# Patient Record
Sex: Male | Born: 1965 | Race: White | Hispanic: No | Marital: Married | Smoking: Former smoker
Health system: Southern US, Community
[De-identification: ages and names within clinical notes are randomized; demographics above are authoritative.]

## PROBLEM LIST (undated history)

## (undated) DIAGNOSIS — I10 Essential (primary) hypertension: Secondary | ICD-10-CM

## (undated) HISTORY — PX: APPENDECTOMY: SHX54

---

## 2007-01-09 ENCOUNTER — Emergency Department: Payer: Self-pay | Admitting: Emergency Medicine

## 2007-01-09 ENCOUNTER — Other Ambulatory Visit: Payer: Self-pay

## 2008-02-23 IMAGING — CR DG CHEST 2V
1 series · 2 of 2 positions shown · non-contrast
Comparison: none

REASON FOR EXAM: Shortness of breath
COMMENTS:

PROCEDURE:     DXR - DXR CHEST PA (OR AP) AND LATERAL  - January 09, 2007  [DATE]
RESULT:
HISTORY: Shortness of breath.

[Series 1: view not recorded · 0.17mm/px · 2 of 2 slices shown]
[im 1/2]
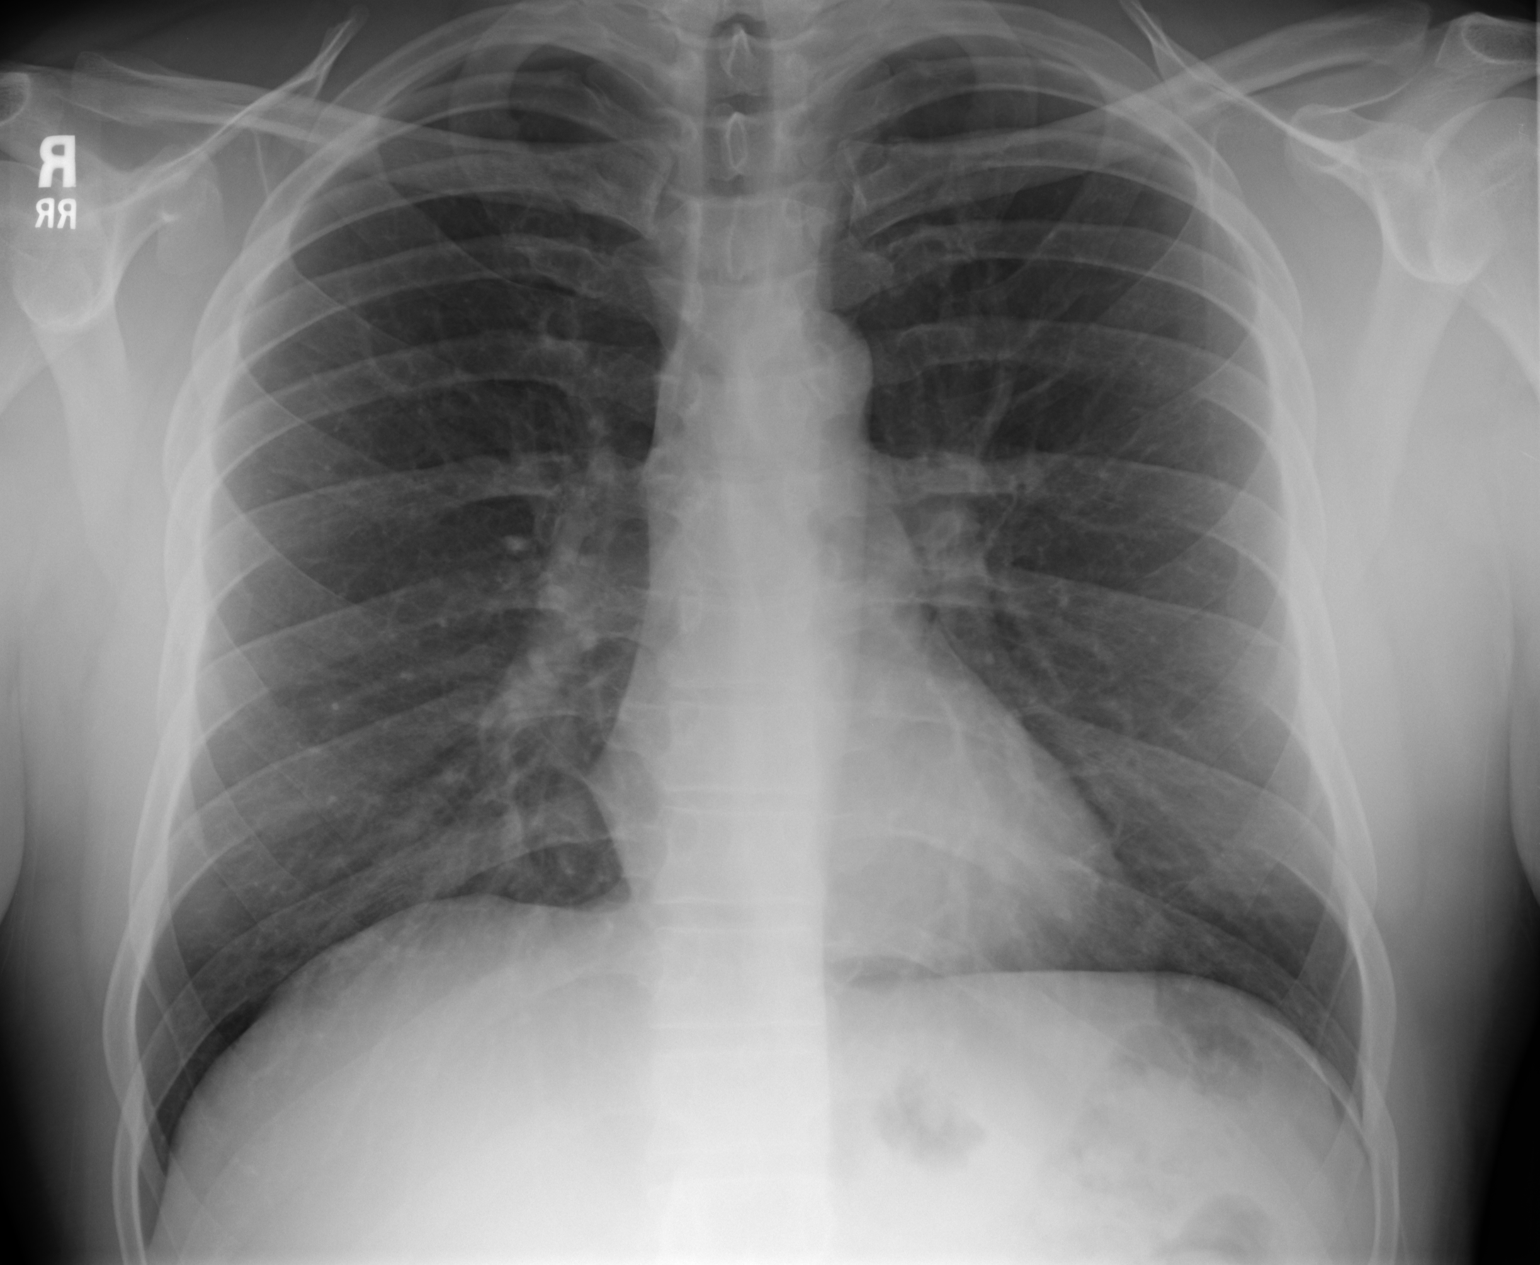
[im 2/2]
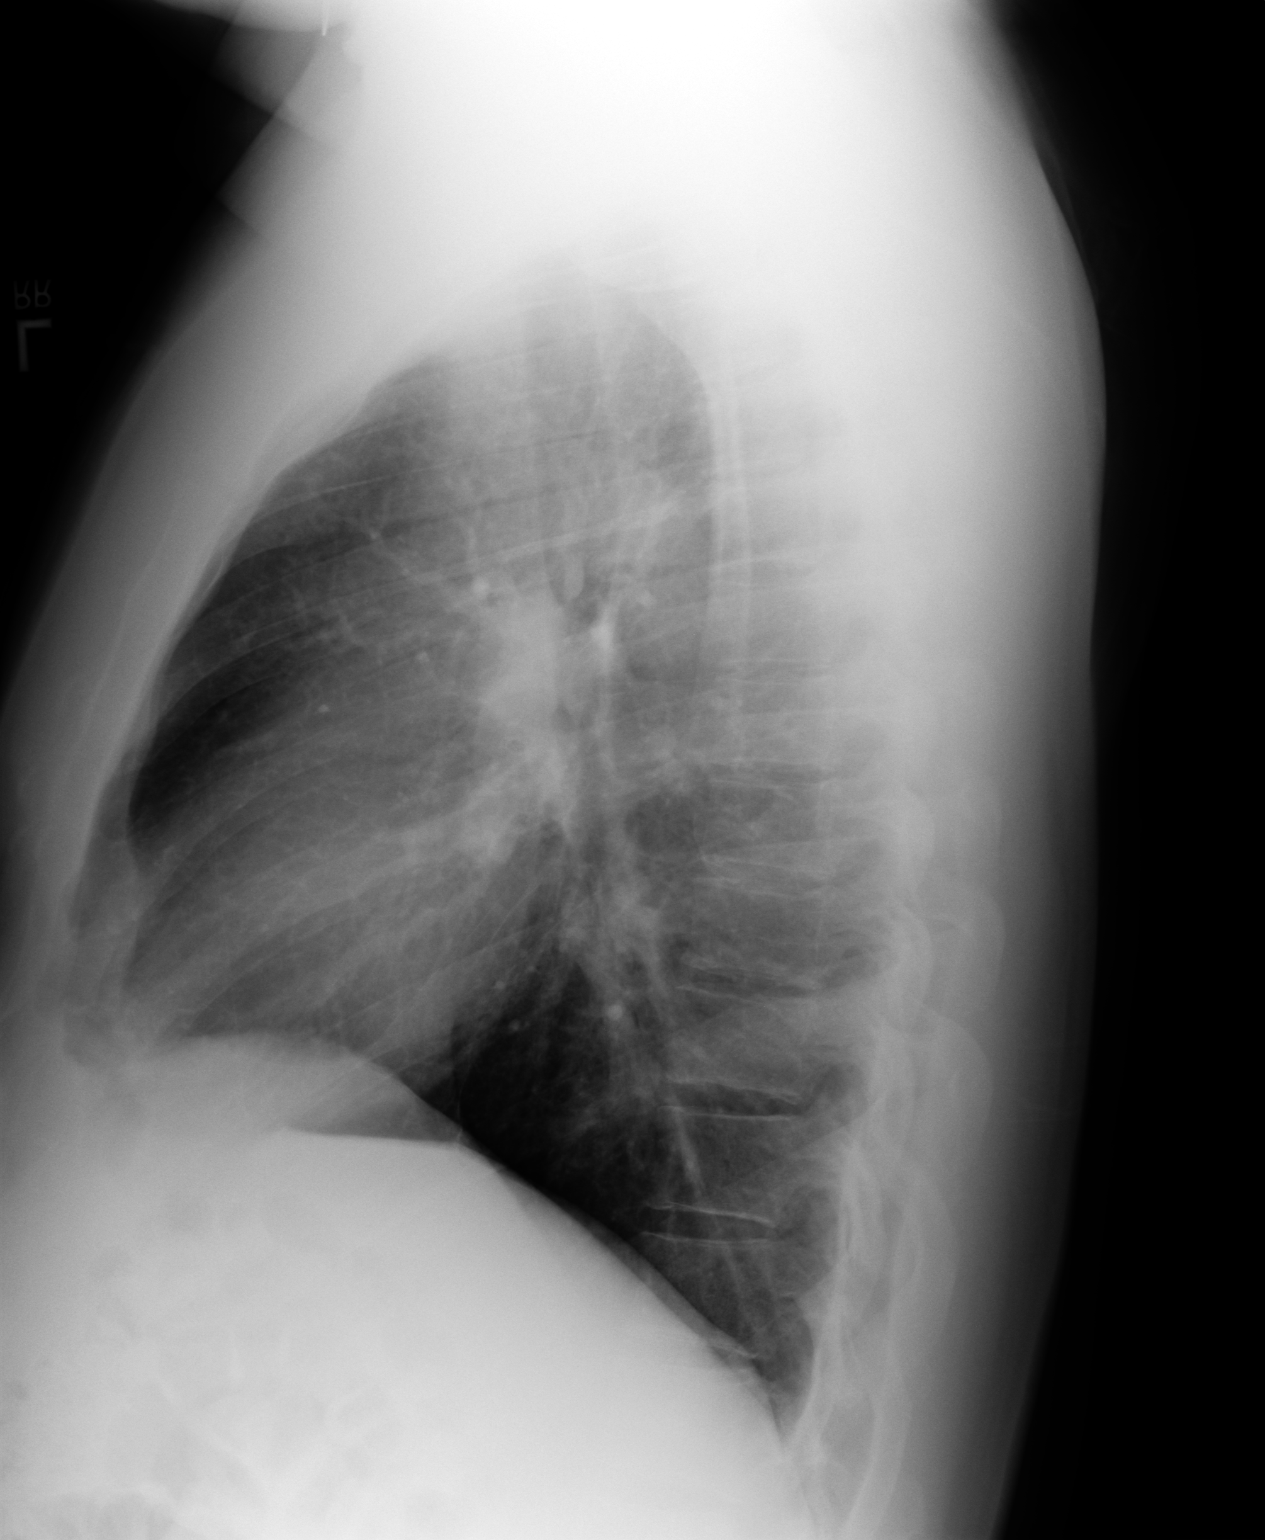

[2 of 2 positions shown; findings below may reference images not displayed]

FINDINGS: PA and lateral views of the chest are interpreted without
comparison and demonstrate a normal size cardiac silhouette.  The lungs
demonstrate no effusion, consolidation, or pneumothorax.  The bones and soft
tissue of the chest demonstrate no acute abnormality.
IMPRESSION: No acute cardiopulmonary abnormality is identified.

## 2020-11-29 ENCOUNTER — Observation Stay
Admission: EM | Admit: 2020-11-29 | Discharge: 2020-11-30 | Disposition: A | Discharge status: Home / Self Care | Payer: No Typology Code available for payment source | Attending: Internal Medicine | Admitting: Internal Medicine

## 2020-11-29 ENCOUNTER — Emergency Department: Payer: No Typology Code available for payment source

## 2020-11-29 ENCOUNTER — Other Ambulatory Visit: Payer: Self-pay

## 2020-11-29 DIAGNOSIS — I161 Hypertensive emergency: Secondary | ICD-10-CM | POA: Diagnosis present

## 2020-11-29 DIAGNOSIS — R079 Chest pain, unspecified: Secondary | ICD-10-CM

## 2020-11-29 DIAGNOSIS — R0789 Other chest pain: Principal | ICD-10-CM | POA: Insufficient documentation

## 2020-11-29 DIAGNOSIS — I10 Essential (primary) hypertension: Secondary | ICD-10-CM | POA: Insufficient documentation

## 2020-11-29 DIAGNOSIS — Z20822 Contact with and (suspected) exposure to covid-19: Secondary | ICD-10-CM | POA: Diagnosis not present

## 2020-11-29 DIAGNOSIS — R0602 Shortness of breath: Secondary | ICD-10-CM

## 2020-11-29 DIAGNOSIS — Z87891 Personal history of nicotine dependence: Secondary | ICD-10-CM | POA: Diagnosis not present

## 2020-11-29 DIAGNOSIS — E669 Obesity, unspecified: Secondary | ICD-10-CM | POA: Diagnosis present

## 2020-11-29 DIAGNOSIS — I160 Hypertensive urgency: Secondary | ICD-10-CM | POA: Diagnosis present

## 2020-11-29 HISTORY — DX: Essential (primary) hypertension: I10

## 2020-11-29 LAB — LIPID PANEL
Cholesterol: 189 mg/dL (ref 0–200)
HDL: 32 mg/dL — ABNORMAL LOW (ref >40)
LDL Cholesterol: 97 mg/dL (ref 0–99)
Total CHOL/HDL Ratio: 5.9 RATIO
Triglycerides: 299 mg/dL — ABNORMAL HIGH (ref <150)
VLDL: 60 mg/dL — ABNORMAL HIGH (ref 0–40)

## 2020-11-29 LAB — BASIC METABOLIC PANEL
Anion gap: 12 (ref 5–15)
BUN: 11 mg/dL (ref 6–20)
CO2: 26 mmol/L (ref 22–32)
Calcium: 9.3 mg/dL (ref 8.9–10.3)
Chloride: 100 mmol/L (ref 98–111)
Creatinine, Ser: 1.04 mg/dL (ref 0.61–1.24)
GFR, Estimated: >60 mL/min (ref >60)
Glucose, Bld: 141 mg/dL — ABNORMAL HIGH (ref 70–99)
Potassium: 3.9 mmol/L (ref 3.5–5.1)
Sodium: 138 mmol/L (ref 135–145)

## 2020-11-29 LAB — TROPONIN I (HIGH SENSITIVITY)
Troponin I (High Sensitivity): 125 ng/L (ref <18)
Troponin I (High Sensitivity): 143 ng/L (ref <18)

## 2020-11-29 LAB — CBC
HCT: 46 % (ref 39.0–52.0)
Hemoglobin: 16.5 g/dL (ref 13.0–17.0)
MCH: 31.5 pg (ref 26.0–34.0)
MCHC: 35.9 g/dL (ref 30.0–36.0)
MCV: 88 fL (ref 80.0–100.0)
Platelets: 193 10*3/uL (ref 150–400)
RBC: 5.23 MIL/uL (ref 4.22–5.81)
RDW: 12.5 % (ref 11.5–15.5)
WBC: 6.7 10*3/uL (ref 4.0–10.5)
nRBC: 0 % (ref 0.0–0.2)

## 2020-11-29 LAB — PROTIME-INR
INR: 0.9 (ref 0.8–1.2)
Prothrombin Time: 11.5 seconds (ref 11.4–15.2)

## 2020-11-29 LAB — HEPARIN LEVEL (UNFRACTIONATED): Heparin Unfractionated: 0.14 IU/mL — ABNORMAL LOW (ref 0.30–0.70)

## 2020-11-29 LAB — RESP PANEL BY RT-PCR (FLU A&B, COVID) ARPGX2
Influenza A by PCR: NEGATIVE
Influenza B by PCR: NEGATIVE
SARS Coronavirus 2 by RT PCR: NEGATIVE

## 2020-11-29 LAB — BRAIN NATRIURETIC PEPTIDE: B Natriuretic Peptide: 32 pg/mL (ref 0.0–100.0)

## 2020-11-29 LAB — APTT: aPTT: 31 seconds (ref 24–36)

## 2020-11-29 MED ORDER — ATORVASTATIN CALCIUM 20 MG PO TABS
40.0000 mg | ORAL_TABLET | Freq: Every day | ORAL | Status: DC
  Administered 2020-11-29: 40 mg via ORAL
  Filled 2020-11-29: qty 2

## 2020-11-29 MED ORDER — LABETALOL HCL 5 MG/ML IV SOLN
10.0000 mg | Freq: Once | INTRAVENOUS | Status: AC
  Administered 2020-11-29: 10 mg via INTRAVENOUS
  Filled 2020-11-29: qty 4

## 2020-11-29 MED ORDER — HEPARIN BOLUS VIA INFUSION
4000.0000 [IU] | Freq: Once | INTRAVENOUS | Status: AC
  Administered 2020-11-29: 4000 [IU] via INTRAVENOUS
  Filled 2020-11-29: qty 4000

## 2020-11-29 MED ORDER — LABETALOL HCL 5 MG/ML IV SOLN: 10.0000 mg | INTRAVENOUS | Status: DC | PRN

## 2020-11-29 MED ORDER — ACETAMINOPHEN 325 MG PO TABS: 325.0000 mg | ORAL_TABLET | ORAL | Status: DC | PRN

## 2020-11-29 MED ORDER — ONDANSETRON HCL 4 MG/2ML IJ SOLN
4.0000 mg | Freq: Four times a day (QID) | INTRAMUSCULAR | Status: DC | PRN
  Administered 2020-11-29: 4 mg via INTRAVENOUS
  Filled 2020-11-29: qty 2

## 2020-11-29 MED ORDER — ASPIRIN 81 MG PO CHEW
162.0000 mg | CHEWABLE_TABLET | Freq: Once | ORAL | Status: AC
  Administered 2020-11-29: 162 mg via ORAL
  Filled 2020-11-29: qty 2

## 2020-11-29 MED ORDER — NITROGLYCERIN 2 % TD OINT
1.0000 [in_us] | TOPICAL_OINTMENT | Freq: Once | TRANSDERMAL | Status: AC
  Administered 2020-11-29: 1 [in_us] via TOPICAL
  Filled 2020-11-29: qty 1

## 2020-11-29 MED ORDER — MORPHINE SULFATE (PF) 2 MG/ML IV SOLN
1.0000 mg | INTRAVENOUS | Status: AC | PRN
  Administered 2020-11-29 – 2020-11-30 (×2): 1 mg via INTRAVENOUS
  Filled 2020-11-29 (×3): qty 1

## 2020-11-29 MED ORDER — HEPARIN (PORCINE) 25000 UT/250ML-% IV SOLN
2100.0000 [IU]/h | INTRAVENOUS | Status: DC
  Administered 2020-11-29: 1300 [IU]/h via INTRAVENOUS
  Administered 2020-11-30: 1700 [IU]/h via INTRAVENOUS
  Filled 2020-11-29 (×2): qty 250

## 2020-11-29 NOTE — ED Notes (Signed)
MD in room

## 2020-11-29 NOTE — H&P (Signed)
History and Physical   Willie Rose DQQ:[id number] DOB: Jun 29, 1966 DOA: 11/29/2020  PCP: Patient, No Pcp Per  Outpatient Specialists: none  Patient coming from: home via private vehicle  I have personally briefly reviewed patient's old medical records in [facility] EMR.  Chief Concern: hypertensive emergency  HPI: Willie Rose is a 54 y.o. male with medical history significant for chronic L4-5 lumbar back pain on flexeril, presented to the ED from urgent care center via private vehicle for chief concern of SBP 240.  He reports he has not been to the primary provider for years and is currently not taking any medications for blood pressure.  He reports that the chest pressure and shortness of breath started on 11/28/2020.  He gets intermittent episodes while at rest and while doing something.  He reports that when he gets these episodes they last approximately 10 to 15 minutes.  The goal went away on their own.  He denies numbness and tingling to his extremities.  He denies jaw discomfort.  He denies abnormal taste in his mouth.  He states he drove to the ED last evening for these symptoms and parked in the parking lot of the emergency department, however he did not go inside because the symptoms went away.   He denies having feelings like this before.  Social history: lives spouse, he runs an Adult nurse. Infrequent tobacco use and as not smoked in 2.5 weeks. Endorses etoh use 3-4 x/week and each time is 2- beers at a time. Endorses marijuana smoking once per week in [place].   Allergies: none   ED Course: Discussed with ED provider, requesting admission for NSTEMI secondary to hypertensive emergency  Review of Systems: As per HPI otherwise 10 point review of systems negative.  Assessment/Plan  Principal Problem:   Chest pain at rest Active Problems:   Hypertensive emergency   Obesity with body mass index (BMI) of 30.0 to 39.9   Shortness of breath   Hypertensive  emergency- -Per ED provider, at urgent care center his SBP was 240 -In the ED, his blood pressure was initially 187-195 SBP status post labetalol 10 mg IV once per ED provider -Admit to progressive cardiac observation  Chest pain with elevated troponin-suspect multifactorial including NSTEMI versus hypertensive emergency/urgency -Heparin GTT -Status post aspirin 162 mg p.o. in the ED and nitroglycerin ointment per ED provider  -Cardiology consulted - checking lipid panel  - atorvastatin 40 mg qhs today  Tobacco use-counseling given to patient to quit and he endorses understanding and compliance  Prn medications: ondansetron, acetaminophen, and morphine  Chart reviewed.   DVT prophylaxis: Heparin GGT Code Status: full code  Diet: Heart healthy, n.p.o. at midnight Family Communication: discussed with spouse at beside Disposition Plan: pending clinical course Consults called: cardiology Admission status: observation with telemetery  Past Medical History:  Diagnosis Date  . Hypertension    Social History:  reports that he has quit smoking. He has quit using smokeless tobacco. He reports current alcohol use. He reports that he does not use drugs.  Family history: Family history reviewed and mother passed away from MI at age 64.   Prior to Admission medications   Not on File   Physical Exam: Vitals:   11/29/20 1424 11/29/20 1630 11/29/20 1640 11/29/20 1700  BP: (!) 187/110 (!) 192/133  (!) 195/135  Pulse: 88 75 83 74  Resp: 20 16 19 13   Temp: 98.3 F (36.8 C)     TempSrc: Oral  SpO2: 94% 96% 96% 95%  Weight: 117.9 kg     Height: 6' (1.829 m)      Constitutional: appears age appropriate , NAD, calm, comfortable Eyes: PERRL, lids and conjunctivae normal ENMT: Mucous membranes are moist. Posterior pharynx clear of any exudate or lesions. Age-appropriate dentition. Hearing appropriate Neck: normal, supple, no masses, no thyromegaly Respiratory: clear to auscultation  bilaterally, no wheezing, no crackles. Normal respiratory effort. No accessory muscle use.  Cardiovascular: Regular rate and rhythm, no murmurs / rubs / gallops. No extremity edema. 2+ pedal pulses. No carotid bruits.  Abdomen: no tenderness, no masses palpated, no hepatosplenomegaly. Bowel sounds positive.  Musculoskeletal: no clubbing / cyanosis. No joint deformity upper and lower extremities. Good ROM, no contractures, no atrophy. Normal muscle tone.  Skin: no rashes, lesions, ulcers. No induration Neurologic: Sensation intact. Strength 5/5 in all 4.  Psychiatric: Normal judgment and insight. Alert and oriented x 3. Normal mood.   EKG: Independently reviewed, showing NSR with rate of 88, Qtc 459  Chest x-ray on Admission: Personally reviewed and I agree with radiologist reading as below.  DG Chest Portable 1 View  Result Date: 11/29/2020 CLINICAL DATA:  Chest pressure on the left for 2 weeks. EXAM: PORTABLE CHEST 1 VIEW COMPARISON:  PA and lateral chest 01/09/2007. FINDINGS: Lungs clear. Heart size normal. Aortic atherosclerosis. No pneumothorax or pleural fluid. IMPRESSION: No acute disease. Aortic Atherosclerosis (ICD10-I70.0). Electronically Signed   By: Drusilla Kanner M.D.   On: 11/29/2020 16:21   Labs on Admission: I have personally reviewed following labs  CBC: Recent Labs  Lab 11/29/20 1426  WBC 6.7  HGB 16.5  HCT 46.0  MCV 88.0  PLT 193   Basic Metabolic Panel: Recent Labs  Lab 11/29/20 1426  NA 138  K 3.9  CL 100  CO2 26  GLUCOSE 141*  BUN 11  CREATININE 1.04  CALCIUM 9.3   Coagulation Profile: Recent Labs  Lab 11/29/20 1641  INR 0.9   Breeonna Mone N Adarryl Goldammer D.O. [facility]  If 12AM-7AM, please contact overnight-coverage provider If 7AM-7PM, please contact day coverage provider [email or web address]  11/29/2020, 5:49 PM

## 2020-11-29 NOTE — ED Notes (Signed)
Date and time results received: 11/29/20 15:09  Test: troponin Critical Value: 125  Name of Provider Notified: Cyril Loosen, MD  Orders Received? Or Actions Taken?: Protocols continued.

## 2020-11-29 NOTE — ED Provider Notes (Addendum)
[facility] Emergency Department Provider Note    First MD Initiated Contact with Patient 11/29/20 1556     (approximate)  I have reviewed the triage vital signs and the nursing notes.   HISTORY  Chief Complaint Chest Pain and Hypertension    HPI Willie Rose is a 54 y.o. male with a history of high blood pressure presents to the ER for several weeks of intermittent chest pain describes it as dull achy sitting on his chest lasting roughly 15 to 20 minutes.  Occasional palpitations.  Not currently on any antihypertensive medications.  Has been under some stress recently.  Just recently stopped smoking.  Denies any history of cardiac illness.  Had an episode last night where he actually drove to the ER parking lot but decided did not come in.  Was seen in [facility] this morning was sent to the ER due to significantly elevated blood pressure and description of chest pain.    Past Medical History:  Diagnosis Date  . Hypertension    No family history on file.  There are no problems to display for this patient.     Prior to Admission medications   Not on File    Allergies Patient has no allergy information on record.    Social History Social History   Tobacco Use  . Smoking status: Former Games developer  . Smokeless tobacco: Former Engineer, water Use Topics  . Alcohol use: Yes  . Drug use: Never    Review of Systems Patient denies headaches, rhinorrhea, blurry vision, numbness, shortness of breath, chest pain, edema, cough, abdominal pain, nausea, vomiting, diarrhea, dysuria, fevers, rashes or hallucinations unless otherwise stated above in HPI. ____________________________________________   PHYSICAL EXAM:  VITAL SIGNS: Vitals:   11/29/20 1640 11/29/20 1700  BP:  (!) 195/135  Pulse: 83 74  Resp: 19 13  Temp:    SpO2: 96% 95%    Constitutional: Alert and oriented.  Eyes: Conjunctivae are normal.  Head: Atraumatic. Nose: No  congestion/rhinnorhea. Mouth/Throat: Mucous membranes are moist.   Neck: No stridor. Painless ROM.  Cardiovascular: Normal rate, regular rhythm. Grossly normal heart sounds.  Good peripheral circulation. Respiratory: Normal respiratory effort.  No retractions. Lungs CTAB. Gastrointestinal: Soft and nontender. No distention. No abdominal bruits. No CVA tenderness. Genitourinary:  Musculoskeletal: No lower extremity tenderness nor edema.  No joint effusions. Neurologic:  Normal speech and language. No gross focal neurologic deficits are appreciated. No facial droop Skin:  Skin is warm, dry and intact. No rash noted. Psychiatric: Mood and affect are normal. Speech and behavior are normal.  ____________________________________________   LABS (all labs ordered are listed, but only abnormal results are displayed)  Results for orders placed or performed during the hospital encounter of 11/29/20 (from the past 24 hour(s))  Basic metabolic panel     Status: Abnormal   Collection Time: 11/29/20  2:26 PM  Result Value Ref Range   Sodium 138 135 - 145 mmol/L   Potassium 3.9 3.5 - 5.1 mmol/L   Chloride 100 98 - 111 mmol/L   CO2 26 22 - 32 mmol/L   Glucose, Bld 141 (H) 70 - 99 mg/dL   BUN 11 6 - 20 mg/dL   Creatinine, Ser 1.60 0.61 - 1.24 mg/dL   Calcium 9.3 8.9 - 73.7 mg/dL   GFR, Estimated >10 >62 mL/min   Anion gap 12 5 - 15  CBC     Status: None   Collection Time: 11/29/20  2:26  PM  Result Value Ref Range   WBC 6.7 4.0 - 10.5 K/uL   RBC 5.23 4.22 - 5.81 MIL/uL   Hemoglobin 16.5 13.0 - 17.0 g/dL   HCT 91.4 39 - 52 %   MCV 88.0 80.0 - 100.0 fL   MCH 31.5 26.0 - 34.0 pg   MCHC 35.9 30.0 - 36.0 g/dL   RDW 78.2 95.6 - 21.3 %   Platelets 193 150 - 400 K/uL   nRBC 0.0 0.0 - 0.2 %  Troponin I (High Sensitivity)     Status: Abnormal   Collection Time: 11/29/20  2:26 PM  Result Value Ref Range   Troponin I (High Sensitivity) 125 (HH) <18 ng/L  Brain natriuretic peptide     Status: None    Collection Time: 11/29/20  2:26 PM  Result Value Ref Range   B Natriuretic Peptide 32.0 0.0 - 100.0 pg/mL  Protime-INR     Status: None   Collection Time: 11/29/20  4:41 PM  Result Value Ref Range   Prothrombin Time 11.5 11.4 - 15.2 seconds   INR 0.9 0.8 - 1.2  APTT     Status: None   Collection Time: 11/29/20  4:41 PM  Result Value Ref Range   aPTT 31 24 - 36 seconds   ____________________________________________  EKG My review and personal interpretation at Time: 14:28   Indication: chest pain  Rate: 85  Rhythm: sinus Axis: normal Other: normal intervals, no stemi ____________________________________________  RADIOLOGY  I personally reviewed all radiographic images ordered to evaluate for the above acute complaints and reviewed radiology reports and findings.  These findings were personally discussed with the patient.  Please see medical record for radiology report.  ____________________________________________   PROCEDURES  Procedure(s) performed:  .Critical Care Performed by: Willy Eddy, MD Authorized by: Willy Eddy, MD   Critical care provider statement:    Critical care time (minutes):  35   Critical care time was exclusive of:  Separately billable procedures and treating other patients   Critical care was necessary to treat or prevent imminent or life-threatening deterioration of the following conditions:  Cardiac failure   Critical care was time spent personally by me on the following activities:  Development of treatment plan with patient or surrogate, discussions with consultants, evaluation of patient's response to treatment, examination of patient, obtaining history from patient or surrogate, ordering and performing treatments and interventions, ordering and review of laboratory studies, ordering and review of radiographic studies, pulse oximetry, re-evaluation of patient's condition and review of old charts      Critical Care performed:  yes ____________________________________________   INITIAL IMPRESSION / ASSESSMENT AND PLAN / ED COURSE  Pertinent labs & imaging results that were available during my care of the patient were reviewed by me and considered in my medical decision making (see chart for details).   DDX: ACS, pericarditis, esophagitis, pe, dissection, pna, bronchitis, costochondritis   EDREES VALENT is a 54 y.o. who presents to the ED with symptoms as described above.  Patient nontoxic-appearing but with significant elevated blood pressure.  EKG with some nonspecific changes.  No STEMI criteria.  Troponin is elevated.  IVC secondary to demand ischemia but given his description of chest pains also concerning for unstable angina will heparinize.  Have given aspirin.  Will treat blood pressure.  Patient will require hospitalization for further medical work-up.     The patient was evaluated in Emergency Department today for the symptoms described in the history of present illness.  He/she was evaluated in the context of the global COVID-19 pandemic, which necessitated consideration that the patient might be at risk for infection with the SARS-CoV-2 virus that causes COVID-19. Institutional protocols and algorithms that pertain to the evaluation of patients at risk for COVID-19 are in a state of rapid change based on information released by regulatory bodies including the CDC and federal and state organizations. These policies and algorithms were followed during the patient's care in the ED.  As part of my medical decision making, I reviewed the following data within the electronic MEDICAL RECORD NUMBER Nursing notes reviewed and incorporated, Labs reviewed, notes from prior ED visits and [place] Controlled Substance Database   ____________________________________________   FINAL CLINICAL IMPRESSION(S) / ED DIAGNOSES  Final diagnoses:  Chest pain, unspecified type  Hypertension, unspecified type      NEW MEDICATIONS  STARTED DURING THIS VISIT:  New Prescriptions   No medications on file     Note:  This document was prepared using Dragon voice recognition software and may include unintentional dictation errors.    Willy Eddy, MD 11/29/20 1654    Willy Eddy, MD 11/29/20 1710

## 2020-11-29 NOTE — ED Notes (Signed)
Pt's sats decreased to 88-90% while sleeping. O2 2 L [place] applied, sats increased to 95-96%

## 2020-11-29 NOTE — ED Notes (Signed)
Informed [facility] RN of bed assignment °

## 2020-11-29 NOTE — ED Triage Notes (Addendum)
Pt to ED POV sent from [facility] for chest pressure and HTN.  States has had chest pressure to left side with no radiation x2 weeks.  States has had high BP for 10 years but stopped taking medications. Was told SBP 240 at [facility] today.   PT in NAD, playing on cellphone in triage

## 2020-11-29 NOTE — Consult Note (Signed)
ANTICOAGULATION CONSULT NOTE - Follow Up Consult  Pharmacy Consult for Heparin Indication: chest pain/ACS  No known drug allergies  Patient Measurements: Height: 6' (182.9 cm) Weight: 117.9 kg (260 lb) IBW/kg (Calculated) : 77.6 Heparin Dosing Weight: 103.3 kg  Vital Signs: Temp: 98.3 F (36.8 C) (12/02 1424) Temp Source: Oral (12/02 1424) BP: 187/110 (12/02 1424) Pulse Rate: 88 (12/02 1424)  Labs: Recent Labs    11/29/20 1426  HGB 16.5  HCT 46.0  PLT 193  CREATININE 1.04  TROPONINIHS 125*    Estimated Creatinine Clearance: 110.1 mL/min (by C-G formula based on SCr of 1.04 mg/dL).   Medications:  Scheduled:  . aspirin  162 mg Oral Once  . nitroGLYCERIN  1 inch Topical Once    Assessment: Plan to start heparin drip for ACS/NSTEMI. No active interacting medications or allergies. H/H and Plts WNL. Trop-HS 125ng/L on admit.  Goal of Therapy:  Heparin level 0.3-0.7 units/ml Monitor platelets by anticoagulation protocol: Yes   Plan:  Give 4000 units bolus x 1 Start heparin infusion at 1300 units/hr Check anti-Xa level in 6 hours and daily while on heparin Continue to monitor H&H and platelets  Sherlynn Carbon Liviah Cake 11/29/2020,4:24 PM

## 2020-11-30 ENCOUNTER — Observation Stay: Admit: 2020-11-30 | Payer: No Typology Code available for payment source

## 2020-11-30 ENCOUNTER — Encounter: Payer: Self-pay | Admitting: Internal Medicine

## 2020-11-30 ENCOUNTER — Observation Stay (HOSPITAL_BASED_OUTPATIENT_CLINIC_OR_DEPARTMENT_OTHER)
Admit: 2020-11-30 | Discharge: 2020-11-30 | Disposition: A | Discharge status: Home / Self Care | Payer: No Typology Code available for payment source | Attending: Internal Medicine | Admitting: Internal Medicine

## 2020-11-30 ENCOUNTER — Observation Stay: Payer: No Typology Code available for payment source

## 2020-11-30 DIAGNOSIS — E669 Obesity, unspecified: Secondary | ICD-10-CM

## 2020-11-30 DIAGNOSIS — I160 Hypertensive urgency: Secondary | ICD-10-CM

## 2020-11-30 DIAGNOSIS — R079 Chest pain, unspecified: Secondary | ICD-10-CM | POA: Diagnosis not present

## 2020-11-30 LAB — NM MYOCAR MULTI W/SPECT W/WALL MOTION / EF
Estimated workload: 8.6 METS
Exercise duration (min): 7 min
Exercise duration (sec): 3 s
LV dias vol: 161 mL (ref 62–150)
LV sys vol: 69 mL
MPHR: 153 {beats}/min
Peak HR: 153 {beats}/min
Percent HR: 91 %
Rest HR: 72 {beats}/min
SDS: 6
SRS: 1
SSS: 6

## 2020-11-30 LAB — ECHOCARDIOGRAM COMPLETE
AR max vel: 2.75 cm2
AV Area VTI: 2.88 cm2
AV Area mean vel: 2.9 cm2
AV Mean grad: 5 mmHg
AV Peak grad: 8 mmHg
Ao pk vel: 1.41 m/s
Area-P 1/2: 5.34 cm2
Height: 72 in
S' Lateral: 3.36 cm
Weight: 4315.2 oz

## 2020-11-30 LAB — HEMOGLOBIN A1C
Hgb A1c MFr Bld: 6 % — ABNORMAL HIGH (ref 4.8–5.6)
Mean Plasma Glucose: 125.5 mg/dL

## 2020-11-30 LAB — HIV ANTIBODY (ROUTINE TESTING W REFLEX): HIV Screen 4th Generation wRfx: NONREACTIVE

## 2020-11-30 LAB — HEPARIN LEVEL (UNFRACTIONATED)
Heparin Unfractionated: 0.32 IU/mL (ref 0.30–0.70)
Heparin Unfractionated: 0.14 IU/mL — ABNORMAL LOW (ref 0.30–0.70)

## 2020-11-30 MED ORDER — HEPARIN BOLUS VIA INFUSION
3000.0000 [IU] | Freq: Once | INTRAVENOUS | Status: AC
  Administered 2020-11-30: 3000 [IU] via INTRAVENOUS
  Filled 2020-11-30: qty 3000

## 2020-11-30 MED ORDER — TECHNETIUM TC 99M TETROFOSMIN IV KIT
31.2400 | PACK | Freq: Once | INTRAVENOUS | Status: AC | PRN
  Administered 2020-11-30: 31.24 via INTRAVENOUS

## 2020-11-30 MED ORDER — TECHNETIUM TC 99M TETROFOSMIN IV KIT
10.8100 | PACK | Freq: Once | INTRAVENOUS | Status: AC | PRN
  Administered 2020-11-30: 10.81 via INTRAVENOUS

## 2020-11-30 MED ORDER — HYDROCHLOROTHIAZIDE 25 MG PO TABS
25.0000 mg | ORAL_TABLET | Freq: Every day | ORAL | Status: DC
  Administered 2020-11-30: 25 mg via ORAL
  Filled 2020-11-30: qty 1

## 2020-11-30 MED ORDER — LISINOPRIL 10 MG PO TABS
10.0000 mg | ORAL_TABLET | Freq: Every day | ORAL | Status: DC
  Administered 2020-11-30: 10 mg via ORAL
  Filled 2020-11-30: qty 1

## 2020-11-30 MED ORDER — HEPARIN BOLUS VIA INFUSION
3100.0000 [IU] | Freq: Once | INTRAVENOUS | Status: AC
  Administered 2020-11-30: 3100 [IU] via INTRAVENOUS
  Filled 2020-11-30: qty 3100

## 2020-11-30 MED ORDER — ASPIRIN 325 MG PO TABS
325.0000 mg | ORAL_TABLET | Freq: Every day | ORAL | Status: DC
  Administered 2020-11-30: 325 mg via ORAL
  Filled 2020-11-30 (×2): qty 1

## 2020-11-30 MED ORDER — LISINOPRIL 10 MG PO TABS: 10.0000 mg | ORAL_TABLET | Freq: Every day | ORAL | 0 refills | Status: AC

## 2020-11-30 MED ORDER — HYDROCHLOROTHIAZIDE 25 MG PO TABS
25.0000 mg | ORAL_TABLET | Freq: Every day | ORAL | 0 refills | Status: DC
Start: 2020-12-01 — End: 2021-01-14

## 2020-11-30 NOTE — Plan of Care (Signed)

## 2020-11-30 NOTE — Consult Note (Signed)
CARDIOLOGY CONSULT NOTE               Patient ID: Willie Rose MRN: [id number] DOB/AGE: [birth year] 54 y.o.  Admit date: 11/29/2020 Referring Physician Londell Moh MD Primary Physician none Primary Cardiologist none Reason for Consultation angina hypertensive urgency  HPI: Patient is a 54 year old white male obese hypertension smoking hyperlipidemia.  Presents with chest pain shortness of breath elevated blood pressure still came to emergency room after multiple episodes with problems emergency room he was found to have elevated systolic blood pressure after being evaluated in urgent care.  Patient states EKG was nondiagnostic but because of elevated blood pressure she was admitted to the emergency room for evaluation.  She denies previous cardiac history she presents for further evaluation  Review of systems complete and found to be negative unless listed above     Past Medical History:  Diagnosis Date  . Hypertension     Past Surgical History:  Procedure Laterality Date  . APPENDECTOMY      Medications Prior to Admission  Medication Sig Dispense Refill Last Dose  . aspirin 81 MG chewable tablet Chew 81 mg by mouth in the morning and at bedtime.   11/29/2020 at 0800  . cyclobenzaprine (FLEXERIL) 10 MG tablet Take 10 mg by mouth 3 (three) times daily as needed.   Past Month at Unknown time   Social History   Socioeconomic History  . Marital status: Married    Spouse name: Not on file  . Number of children: Not on file  . Years of education: Not on file  . Highest education level: Not on file  Occupational History  . Not on file  Tobacco Use  . Smoking status: Former Games developer  . Smokeless tobacco: Former Engineer, water and Sexual Activity  . Alcohol use: Yes  . Drug use: Never  . Sexual activity: Not on file  Other Topics Concern  . Not on file  Social History Narrative  . Not on file   Social Determinants of Health   Financial Resource Strain:   . Difficulty  of Paying Living Expenses: Not on file  Food Insecurity:   . Worried About Programme researcher, broadcasting/film/video in the Last Year: Not on file  . Ran Out of Food in the Last Year: Not on file  Transportation Needs:   . Lack of Transportation (Medical): Not on file  . Lack of Transportation (Non-Medical): Not on file  Physical Activity:   . Days of Exercise per Week: Not on file  . Minutes of Exercise per Session: Not on file  Stress:   . Feeling of Stress : Not on file  Social Connections:   . Frequency of Communication with Friends and Family: Not on file  . Frequency of Social Gatherings with Friends and Family: Not on file  . Attends Religious Services: Not on file  . Active Member of Clubs or Organizations: Not on file  . Attends Banker Meetings: Not on file  . Marital Status: Not on file  Intimate Partner Violence:   . Fear of Current or Ex-Partner: Not on file  . Emotionally Abused: Not on file  . Physically Abused: Not on file  . Sexually Abused: Not on file    History reviewed. No pertinent family history.    Review of systems complete and found to be negative unless listed above      PHYSICAL EXAM  General: Well developed, well nourished, in no acute distress HEENT:  Normocephalic and atramatic Neck:  No JVD.  Lungs: Clear bilaterally to auscultation and percussion. Heart: HRRR . Normal S1 and S2 without gallops or murmurs.  Abdomen: Bowel sounds are positive, abdomen soft and non-tender  Msk:  Back normal, normal gait. Normal strength and tone for age. Extremities: No clubbing, cyanosis or edema.   Neuro: Alert and oriented X 3. Psych:  Good affect, responds appropriately  Labs:   Lab Results  Component Value Date   WBC 6.7 11/29/2020   HGB 16.5 11/29/2020   HCT 46.0 11/29/2020   MCV 88.0 11/29/2020   PLT 193 11/29/2020    Recent Labs  Lab 11/29/20 1426  NA 138  K 3.9  CL 100  CO2 26  BUN 11  CREATININE 1.04  CALCIUM 9.3  GLUCOSE 141*   No  results found for: CKTOTAL, CKMB, CKMBINDEX, TROPONINI  Lab Results  Component Value Date   CHOL 189 11/29/2020   Lab Results  Component Value Date   HDL 32 (L) 11/29/2020   Lab Results  Component Value Date   LDLCALC 97 11/29/2020   Lab Results  Component Value Date   TRIG 299 (H) 11/29/2020   Lab Results  Component Value Date   CHOLHDL 5.9 11/29/2020   No results found for: LDLDIRECT    Radiology: DG Chest Portable 1 View  Result Date: 11/29/2020 CLINICAL DATA:  Chest pressure on the left for 2 weeks. EXAM: PORTABLE CHEST 1 VIEW COMPARISON:  PA and lateral chest 01/09/2007. FINDINGS: Lungs clear. Heart size normal. Aortic atherosclerosis. No pneumothorax or pleural fluid. IMPRESSION: No acute disease. Aortic Atherosclerosis (ICD10-I70.0). Electronically Signed   By: Drusilla Kanner M.D.   On: 11/29/2020 16:21    EKG: Normal sinus rhythm nonspecific ST-T changes  ASSESSMENT AND PLAN:  Chest pain Possible angina Hypertensive urgency Obesity Possible obstructive sleep apnea Smoking Hyperlipidemia Chronic back pain . Plan Agree with admit to telemetry Rule out myocardial infarction follow-up EKG and troponin Recommend aggressive blood pressure control ARB calcium blocker diuretic Recommend sleep study for evaluation of possible obstructive sleep apnea Echocardiogram for assessment of ventricular function wall motion Consider sleep study to rule out sleep apnea Advised patient to refrain from tobacco abuse Consider statin therapy in addition to weight loss and exercise  Signed: Alwyn Pea MD 11/30/2020, 8:27 AM

## 2020-11-30 NOTE — Discharge Summary (Signed)
Physician Discharge Summary  Willie Rose IRC:[id number] DOB: 06/02/1966 DOA: 11/29/2020  PCP: Patient, No Pcp Per  Admit date: 11/29/2020 Discharge date: 11/30/2020  Discharge disposition: Home   Recommendations for Outpatient Follow-Up:   Follow-up with PCP in 1 week for routine health maintenance, BP monitoring and for follow-up of electrolytes and kidney function.   Discharge Diagnosis:   Principal Problem:   Chest pain at rest Active Problems:   Hypertensive urgency   Obesity with body mass index (BMI) of 30.0 to 39.9   Shortness of breath    Discharge Condition: Stable.  Diet recommendation:  Diet Order            Diet 2 gram sodium Room service appropriate? Yes; Fluid consistency: Thin  Diet effective now           Diet - low sodium heart healthy                   Code Status: Full Code     Hospital Course:   Mr. Willie Rose is a 54 year old man with medical history significant for chronic L4-5 lumbar back pain on Flexeril, hypertension, who presented to the hospital because of chest pain and severe hypertension.  Chest pain is described as pressure in the chest and it has been going on for about 2 weeks.  He said he had gone to an urgent care clinic where he was told his systolic blood pressure was 240.  He was advised to come to the ED for further evaluation.  He is a known hypertensive but he said he had not taken any antihypertensives for about 7 years because his blood pressure had improved.  His blood pressure on admission was 187/110.  Troponins were mildly elevated (125 -143).  There was concern for hypertensive emergency and NSTEMI.  He was treated with IV heparin infusion, Lipitor and aspirin.  Cardiologist was consulted.  He underwent exercise stress nuclear study and this was reported as a low risk study.  There was no ST segment deviation noted during the stress test and he had adequate exercise tolerance having been able to exercise for 7  minutes per standard Bruce protocol.  2D echo showed grade 1 diastolic dysfunction with an estimated EF of 60 to 65%.  Thus, NSTEMI was ruled out.  Elevated BP was consistent with hypertensive urgency rather than hypertensive emergency.   Chest pain has resolved.  Blood pressure has improved.  He feels better and is deemed stable for discharge to home today.  He was counseled on the importance of adequate blood pressure control and making positive lifestyle changes including regular exercise, weight loss, healthy eating habits and medical adherence.  He was also advised to get an outpatient sleep study for evaluation for obstructive sleep apnea. Discharge plan was discussed with the patient and his wife at the bedside.  Dr. Juliann Pares, cardiologist, said patient was okay for discharge from his standpoint.     Medical Consultants:    Cardiologist, Dr. Juliann Pares   Discharge Exam:    Vitals:   11/30/20 0044 11/30/20 0352 11/30/20 0726 11/30/20 1255  BP: (!) 157/101 (!) 151/83 (!) 176/103 (!) 145/86  Pulse: 73 65 66 67  Resp: 18 19 17 18   Temp: (!) 97.5 F (36.4 C) 97.7 F (36.5 C) 97.8 F (36.6 C) 97.9 F (36.6 C)  TempSrc: Oral Oral Oral Oral  SpO2: 98% 96% 97% 94%  Weight: 117.8 kg 122.3 kg    Height: 6' (1.829 m)  GEN: NAD SKIN: No rash EYES: EOMI ENT: MMM CV: RRR PULM: CTA B ABD: soft, obese, NT, +BS CNS: AAO x 3, non focal EXT: No edema or tenderness   The results of significant diagnostics from this hospitalization (including imaging, microbiology, ancillary and laboratory) are listed below for reference.     Procedures and Diagnostic Studies:   NM Myocar Multi W/Spect W/Wall Motion / EF  Result Date: 11/30/2020  Blood pressure demonstrated a normal response to exercise.  There was no ST segment deviation noted during stress.  No T wave inversion was noted during stress.  Adequate exercise tolerance exercise 7 minutes standard Bruce protocol  This is a  low risk study.  The left ventricular ejection fraction is mildly decreased (45-54%).  Nuclear stress EF: 50%.  Adequate exercise stress to 7 minutes no evidence of EKG ischemia No symptoms during exercise   ECHOCARDIOGRAM COMPLETE  Result Date: 11/30/2020    ECHOCARDIOGRAM REPORT   Patient Name:   Willie CoderRONALD L Overfield Date of Exam: 11/30/2020 Medical Rec #:  [id number]      Height:       72.0 in Accession #:    [id number]     Weight:       269.7 lb Date of Birth:  [birth year]      BSA:          2.419 m Patient Age:    54 years       BP:           176/103 mmHg Patient Gender: M              HR:           68 bpm. Exam Location:  [facility] Procedure: 2D Echo, Color Doppler, Cardiac Doppler and Strain Analysis Indications:     Acute myocardial infarction  History:         Patient has no prior history of Echocardiogram examinations.                  Signs/Symptoms:Chest Pain and Shortness of Breath; Risk                  Factors:Hypertension.  Sonographer:     Humphrey RollsJoan Heiss RDCS (AE) Referring Phys:  [id number] Lurene ShadowBERNARD Eusebia Grulke Diagnosing Phys: Julien Nordmannimothy Gollan MD  Sonographer Comments: Global longitudinal strain was attempted. IMPRESSIONS  1. Left ventricular ejection fraction, by estimation, is 60 to 65%. The left ventricle has normal function. The left ventricle has no regional wall motion abnormalities. There is mild left ventricular hypertrophy. Left ventricular diastolic parameters are consistent with Grade I diastolic dysfunction (impaired relaxation). The average left ventricular global longitudinal strain is -13.1 %. The global longitudinal strain is abnormal.  2. Right ventricular systolic function is normal. The right ventricular size is normal. FINDINGS  Left Ventricle: Left ventricular ejection fraction, by estimation, is 60 to 65%. The left ventricle has normal function. The left ventricle has no regional wall motion abnormalities. The average left ventricular global longitudinal strain is -13.1 %. The global longitudinal  strain is abnormal. The left ventricular internal cavity size was normal in size. There is mild left ventricular hypertrophy. Left ventricular diastolic parameters are consistent with Grade I diastolic dysfunction (impaired relaxation). Right Ventricle: The right ventricular size is normal. No increase in right ventricular wall thickness. Right ventricular systolic function is normal. Left Atrium: Left atrial size was normal in size. Right Atrium: Right atrial size was normal in size. Pericardium: There is no evidence of pericardial  effusion. Mitral Valve: The mitral valve is normal in structure. No evidence of mitral valve regurgitation. No evidence of mitral valve stenosis. MV peak gradient, 3.5 mmHg. The mean mitral valve gradient is 1.0 mmHg. Tricuspid Valve: The tricuspid valve is normal in structure. Tricuspid valve regurgitation is not demonstrated. No evidence of tricuspid stenosis. Aortic Valve: The aortic valve is normal in structure. Aortic valve regurgitation is not visualized. No aortic stenosis is present. Aortic valve mean gradient measures 5.0 mmHg. Aortic valve peak gradient measures 8.0 mmHg. Aortic valve area, by VTI measures 2.88 cm. Pulmonic Valve: The pulmonic valve was normal in structure. Pulmonic valve regurgitation is not visualized. No evidence of pulmonic stenosis. Aorta: The aortic root is normal in size and structure. Venous: The inferior vena cava is normal in size with greater than 50% respiratory variability, suggesting right atrial pressure of 3 mmHg. IAS/Shunts: No atrial level shunt detected by color flow Doppler.  LEFT VENTRICLE PLAX 2D LVIDd:         4.74 cm  Diastology LVIDs:         3.36 cm  LV e' medial:    5.66 cm/s LV PW:         1.46 cm  LV E/e' medial:  10.8 LV IVS:        1.17 cm  LV e' lateral:   7.72 cm/s LVOT diam:     2.30 cm  LV E/e' lateral: 7.9 LV SV:         71 LV SV Index:   29       2D Longitudinal Strain LVOT Area:     4.15 cm 2D Strain GLS (A2C):   -10.5 %                          2D Strain GLS (A3C):   -13.8 %                         2D Strain GLS (A4C):   -15.0 %                         2D Strain GLS Avg:     -13.1 % RIGHT VENTRICLE RV Basal diam:  3.21 cm LEFT ATRIUM             Index       RIGHT ATRIUM           Index LA diam:        4.10 cm 1.69 cm/m  RA Area:     16.60 cm LA Vol (A2C):   38.7 ml 16.00 ml/m RA Volume:   47.00 ml  19.43 ml/m LA Vol (A4C):   46.9 ml 19.39 ml/m LA Biplane Vol: 44.1 ml 18.23 ml/m  AORTIC VALVE                    PULMONIC VALVE AV Area (Vmax):    2.75 cm     PV Vmax:       1.00 m/s AV Area (Vmean):   2.90 cm     PV Vmean:      68.000 cm/s AV Area (VTI):     2.88 cm     PV VTI:        0.200 m AV Vmax:           141.00 cm/s  PV Peak grad:  4.0 mmHg AV Vmean:  100.000 cm/s PV Mean grad:  2.0 mmHg AV VTI:            0.245 m AV Peak Grad:      8.0 mmHg AV Mean Grad:      5.0 mmHg LVOT Vmax:         93.30 cm/s LVOT Vmean:        69.900 cm/s LVOT VTI:          0.170 m LVOT/AV VTI ratio: 0.69  AORTA Ao Root diam: 3.40 cm MITRAL VALVE MV Area (PHT): 5.34 cm    SHUNTS MV Peak grad:  3.5 mmHg    Systemic VTI:  0.17 m MV Mean grad:  1.0 mmHg    Systemic Diam: 2.30 cm MV Vmax:       0.93 m/s MV Vmean:      48.7 cm/s MV Decel Time: 142 msec MV E velocity: 61.30 cm/s MV A velocity: 73.30 cm/s MV E/A ratio:  0.84 Julien Nordmann MD Electronically signed by Julien Nordmann MD Signature Date/Time: 11/30/2020/3:10:34 PM    Final      Labs:   Basic Metabolic Panel: Recent Labs  Lab 11/29/20 1426  NA 138  K 3.9  CL 100  CO2 26  GLUCOSE 141*  BUN 11  CREATININE 1.04  CALCIUM 9.3   GFR Estimated Creatinine Clearance: 112.2 mL/min (by C-G formula based on SCr of 1.04 mg/dL). Liver Function Tests: No results for input(s): AST, ALT, ALKPHOS, BILITOT, PROT, ALBUMIN in the last 168 hours. No results for input(s): LIPASE, AMYLASE in the last 168 hours. No results for input(s): AMMONIA in the last 168 hours. Coagulation  profile Recent Labs  Lab 11/29/20 1641  INR 0.9    CBC: Recent Labs  Lab 11/29/20 1426  WBC 6.7  HGB 16.5  HCT 46.0  MCV 88.0  PLT 193   Cardiac Enzymes: No results for input(s): CKTOTAL, CKMB, CKMBINDEX, TROPONINI in the last 168 hours. BNP: Invalid input(s): POCBNP CBG: No results for input(s): GLUCAP in the last 168 hours. D-Dimer No results for input(s): DDIMER in the last 72 hours. Hgb A1c Recent Labs    11/30/20 0827  HGBA1C 6.0*   Lipid Profile Recent Labs    11/29/20 2244  CHOL 189  HDL 32*  LDLCALC 97  TRIG 098*  CHOLHDL 5.9   Thyroid function studies No results for input(s): TSH, T4TOTAL, T3FREE, THYROIDAB in the last 72 hours.  Invalid input(s): FREET3 Anemia work up No results for input(s): VITAMINB12, FOLATE, FERRITIN, TIBC, IRON, RETICCTPCT in the last 72 hours. Microbiology Recent Results (from the past 240 hour(s))  Resp Panel by RT-PCR (Flu A&B, Covid) Nasopharyngeal Swab     Status: None   Collection Time: 11/29/20  4:41 PM   Specimen: Nasopharyngeal Swab; Nasopharyngeal(NP) swabs in vial transport medium  Result Value Ref Range Status   SARS Coronavirus 2 by RT PCR NEGATIVE NEGATIVE Final    Comment: (NOTE) SARS-CoV-2 target nucleic acids are NOT DETECTED.  The SARS-CoV-2 RNA is generally detectable in upper respiratory specimens during the acute phase of infection. The lowest concentration of SARS-CoV-2 viral copies this assay can detect is 138 copies/mL. A negative result does not preclude SARS-Cov-2 infection and should not be used as the sole basis for treatment or other patient management decisions. A negative result may occur with  improper specimen collection/handling, submission of specimen other than nasopharyngeal swab, presence of viral mutation(s) within the areas targeted by this assay, and inadequate number of viral copies(<138  copies/mL). A negative result must be combined with clinical observations, patient history,  and epidemiological information. The expected result is Negative.  Fact Sheet for Patients:  BloggerCourse.com  Fact Sheet for Healthcare Providers:  SeriousBroker.it  This test is no t yet approved or cleared by the [place] FDA and  has been authorized for detection and/or diagnosis of SARS-CoV-2 by FDA under an Emergency Use Authorization (EUA). This EUA will remain  in effect (meaning this test can be used) for the duration of the COVID-19 declaration under Section 564(b)(1) of the Act, 21 U.S.C.section 360bbb-3(b)(1), unless the authorization is terminated  or revoked sooner.       Influenza A by PCR NEGATIVE NEGATIVE Final   Influenza B by PCR NEGATIVE NEGATIVE Final    Comment: (NOTE) The Xpert Xpress SARS-CoV-2/FLU/RSV plus assay is intended as an aid in the diagnosis of influenza from Nasopharyngeal swab specimens and should not be used as a sole basis for treatment. Nasal washings and aspirates are unacceptable for Xpert Xpress SARS-CoV-2/FLU/RSV testing.  Fact Sheet for Patients: BloggerCourse.com  Fact Sheet for Healthcare Providers: SeriousBroker.it  This test is not yet approved or cleared by the [place] FDA and has been authorized for detection and/or diagnosis of SARS-CoV-2 by FDA under an Emergency Use Authorization (EUA). This EUA will remain in effect (meaning this test can be used) for the duration of the COVID-19 declaration under Section 564(b)(1) of the Act, 21 U.S.C. section 360bbb-3(b)(1), unless the authorization is terminated or revoked.  Performed at [facility], [address], [place], [place] [address]      Discharge Instructions:   Discharge Instructions    Diet - low sodium heart healthy   Complete by: As directed    Discharge instructions   Complete by: As directed    Follow-up with PCP in 1 week to monitor  blood pressure, kidney function and electrolytes.  Outpatient evaluation for sleep study is recommended.  PCP can arrange this.  Lifestyle changes including regular exercise, weight loss and healthy eating habits are also recommended.   Increase activity slowly   Complete by: As directed      Allergies as of 11/30/2020   Not on File     Medication List    STOP taking these medications   aspirin 81 MG chewable tablet     TAKE these medications   cyclobenzaprine 10 MG tablet Commonly known as: FLEXERIL Take 10 mg by mouth 3 (three) times daily as needed.   hydrochlorothiazide 25 MG tablet Commonly known as: HYDRODIURIL Take 1 tablet (25 mg total) by mouth daily. Start taking on: December 01, 2020   lisinopril 10 MG tablet Commonly known as: ZESTRIL Take 1 tablet (10 mg total) by mouth daily. Start taking on: December 01, 2020         Time coordinating discharge: 32 minutes  Signed:  Lurene Shadow  [facility] 11/30/2020, 4:23 PM   Pager on [email or web address]. If 7PM-7AM, please contact night-coverage at [email or web address]

## 2020-11-30 NOTE — Progress Notes (Signed)
*  PRELIMINARY RESULTS* Echocardiogram 2D Echocardiogram has been performed.  Willie Rose Willie Rose 11/30/2020, 1:08 PM

## 2020-11-30 NOTE — Progress Notes (Signed)
[facility] Cardiology  Patient Description: Willie Rose is a 54 y/o male with PMH significant for poorly controlled HTN, HLD, obesity and tobacco use who was admitted for hypertensive urgency. The patient presented with c/o dyspnea and chest pain and his troponin levels were elevated thus Cardiology was consulted.   SUBJECTIVE: The patient reports to be feeling much better on today and states that his previous symptoms of "chest tightness" and dyspnea have improved significantly. He denies having any palpitations, syncope, peripheral edema, diaphoresis, dizziness or N/V at this time. The patient reports that he initially experienced a headache after receiving nitroglycerin and labetalol on yesterday, but states that the headache resolved within a few hours. The patient attributes his previous symptoms of chest tightness and dyspnea to his uncontrolled blood pressure and admits that over the past year he lost his mother and he has not been taking his medications or following a healthy diet and has gained a significant amount of weight.   OBJECTIVE: The patient appears well and is sitting comfortably on the side of the bed. The bedside telemetry monitor reveals NSR and the patient continues to be asymptomatic. His blood pressure has much improved and he is now normotensive. His oxygen saturation is 98 % on room air and he has normal effort of breathing.   Vitals:   11/30/20 0044 11/30/20 0352 11/30/20 0726 11/30/20 1255  BP: (!) 157/101 (!) 151/83 (!) 176/103 (!) 145/86  Pulse: 73 65 66 67  Resp: 18 19 17 18   Temp: (!) 97.5 F (36.4 C) 97.7 F (36.5 C) 97.8 F (36.6 C) 97.9 F (36.6 C)  TempSrc: Oral Oral Oral Oral  SpO2: 98% 96% 97% 94%  Weight: 117.8 kg 122.3 kg    Height: 6' (1.829 m)        Intake/Output Summary (Last 24 hours) at 11/30/2020 1326 Last data filed at 11/30/2020 14/02/2020 Gross per 24 hour  Intake 253.58 ml  Output 1025 ml  Net -771.42 ml      PHYSICAL EXAM  General: Well  developed, well nourished, in no acute distress HEENT:  Normocephalic and atraumatic. PERRLA.  Neck:  No JVD. +2  Carotid pulses, negative for bruit. Lungs: Clear bilaterally to auscultation. Chest expansion symmetrical; normal effort of breathing.  Heart: HRRR . Normal S1 and S2 without gallops or murmurs.  Abdomen: Bowel sounds are positive, abdomen soft and non-tender  Msk:  Back normal, normal gait. Normal strength and tone for age. Extremities: No clubbing, cyanosis or edema.   Neuro: Alert and oriented X 3. Psych:  Good affect, responds appropriately  LABS: Basic Metabolic Panel: Recent Labs    11/29/20 1426  NA 138  K 3.9  CL 100  CO2 26  GLUCOSE 141*  BUN 11  CREATININE 1.04  CALCIUM 9.3   Liver Function Tests: No results for input(s): AST, ALT, ALKPHOS, BILITOT, PROT, ALBUMIN in the last 72 hours. No results for input(s): LIPASE, AMYLASE in the last 72 hours. CBC: Recent Labs    11/29/20 1426  WBC 6.7  HGB 16.5  HCT 46.0  MCV 88.0  PLT 193   Cardiac Enzymes: No results for input(s): CKTOTAL, CKMB, CKMBINDEX, TROPONINI in the last 72 hours. BNP: Invalid input(s): POCBNP D-Dimer: No results for input(s): DDIMER in the last 72 hours. Hemoglobin A1C: No results for input(s): HGBA1C in the last 72 hours. Fasting Lipid Panel: Recent Labs    11/29/20 2244  CHOL 189  HDL 32*  LDLCALC 97  TRIG 14/02/21*  CHOLHDL 5.9  Thyroid Function Tests: No results for input(s): TSH, T4TOTAL, T3FREE, THYROIDAB in the last 72 hours.  Invalid input(s): FREET3 Anemia Panel: No results for input(s): VITAMINB12, FOLATE, FERRITIN, TIBC, IRON, RETICCTPCT in the last 72 hours.  DG Chest Portable 1 View  Result Date: 11/29/2020 CLINICAL DATA:  Chest pressure on the left for 2 weeks. EXAM: PORTABLE CHEST 1 VIEW COMPARISON:  PA and lateral chest 01/09/2007. FINDINGS: Lungs clear. Heart size normal. Aortic atherosclerosis. No pneumothorax or pleural fluid. IMPRESSION: No acute  disease. Aortic Atherosclerosis (ICD10-I70.0). Electronically Signed   By: Drusilla Kanner M.D.   On: 11/29/2020 16:21     Echo: Pending.   TELEMETRY: NSR   ASSESSMENT AND PLAN:  Principal Problem:   Chest pain at rest Active Problems:   Hypertensive emergency   Obesity with body mass index (BMI) of 30.0 to 39.9   Shortness of breath    1. Chest pain at rest, resolved at this time, stable, troponin levels are elevated at 125>>143 , ECG unremarkable  -Echocardiogram and ETT/Myoview pending.  -trend troponin levels.   - Continue heparin gtt.   - continue nitroglycerin paste as needed.   -continue asa and statin therapy.   -continue telemetry monitoring.   -Recommend sleep study for evaluation of possible OSA.   2. Hypertensive urgency, reasonably stable at this time, patient is normotensive at this time  -Agree with aggressive blood pressure control with ARB or calcium channel blocker AND diuretic.   - Recommend DASH diet and monitoring BP daily at home upon discharge.   -monitor bp q4h.   3. Obesity  - Upon discharge recommend modest weight loss, portion control and performing aerobic exercises for at least 30 minutest as tolerated.   4. Tobacco use, chronic  -Recommend tobacco cessation.  -Considering nicotine patch if needed.    Dedria Endres, ACNPC-AG  11/30/2020 1:26 PM

## 2020-11-30 NOTE — Consult Note (Signed)
ANTICOAGULATION CONSULT NOTE - Follow Up Consult  Pharmacy Consult for Heparin Indication: chest pain/ACS  No known drug allergies  Patient Measurements: Height: 6' (182.9 cm) Weight: 117.9 kg (260 lb) IBW/kg (Calculated) : 77.6 Heparin Dosing Weight: 103.3 kg  Vital Signs: Temp: 97.5 F (36.4 C) (12/03 0044) Temp Source: Oral (12/03 0044) BP: 157/101 (12/03 0044) Pulse Rate: 73 (12/03 0044)  Labs: Recent Labs    11/29/20 1426 11/29/20 1641 11/29/20 2244  HGB 16.5  --   --   HCT 46.0  --   --   PLT 193  --   --   APTT  --  31  --   LABPROT  --  11.5  --   INR  --  0.9  --   HEPARINUNFRC  --   --  0.14*  CREATININE 1.04  --   --   TROPONINIHS 125* 143*  --     Estimated Creatinine Clearance: 110.1 mL/min (by C-G formula based on SCr of 1.04 mg/dL).   Medications:  Scheduled:  . atorvastatin  40 mg Oral QHS  . heparin  3,000 Units Intravenous Once    Assessment: Plan to start heparin drip for ACS/NSTEMI. No active interacting medications or allergies. H/H and Plts WNL. Trop-HS 125ng/L on admit.  Goal of Therapy:  Heparin level 0.3-0.7 units/ml Monitor platelets by anticoagulation protocol: Yes   Plan:  12/2 @ 2244 = 0.14 Will order Heparin 3000 units IV X 1 bolus and increase drip rate to 1700 units/hr.  Will recheck HL 6 hrs after rate change.   Taejon Irani D 11/30/2020,1:07 AM

## 2020-11-30 NOTE — Progress Notes (Signed)
ANTICOAGULATION CONSULT NOTE - Follow Up Consult  Pharmacy Consult for Heparin Indication: chest pain/ACS  No known drug allergies  Patient Measurements: Height: 6' (182.9 cm) Weight: 122.3 kg (269 lb 11.2 oz) IBW/kg (Calculated) : 77.6 Heparin Dosing Weight: 103.3 kg  Vital Signs: Temp: 97.9 F (36.6 C) (12/03 1255) Temp Source: Oral (12/03 1255) BP: 145/86 (12/03 1255) Pulse Rate: 67 (12/03 1255)  Labs: Recent Labs    11/29/20 1426 11/29/20 1641 11/29/20 2244 11/30/20 0733 11/30/20 1416  HGB 16.5  --   --   --   --   HCT 46.0  --   --   --   --   PLT 193  --   --   --   --   APTT  --  31  --   --   --   LABPROT  --  11.5  --   --   --   INR  --  0.9  --   --   --   HEPARINUNFRC  --   --  0.14* 0.32 0.14*  CREATININE 1.04  --   --   --   --   TROPONINIHS 125* 143*  --   --   --     Estimated Creatinine Clearance: 112.2 mL/min (by C-G formula based on SCr of 1.04 mg/dL).   Medications:  Scheduled:  . aspirin  325 mg Oral Daily  . atorvastatin  40 mg Oral QHS  . hydrochlorothiazide  25 mg Oral Daily  . lisinopril  10 mg Oral Daily    Assessment: Plan to start heparin drip for ACS/NSTEMI. No active interacting medications or allergies. H/H and Plts WNL. Trop-HS 125ng/L on admit.  12/2 2244 HL  = 0.14,  3000 units IV X 1 bolus and increased drip rate to 1700 units/hr.  12/3 0733 HL = 0.32, therapeutic x 1 , continued drip at 1700 units/hr and recheck level in 6 hours  12/3 1416 HL = 0.14,subtherapeutic   Goal of Therapy:  Heparin level 0.3-0.7 units/ml Monitor platelets by anticoagulation protocol: Yes   Plan:  HL 0.14 subtherapeutic, will give 3100 unit bolus x 1 and increase drip to 2100 units/hr and recheck level in 6 hours and CBC daily while on heparin  Sharen Hones, PharmD, BCPS 11/30/2020,3:24 PM

## 2020-11-30 NOTE — Progress Notes (Signed)
ANTICOAGULATION CONSULT NOTE - Follow Up Consult  Pharmacy Consult for Heparin Indication: chest pain/ACS  No known drug allergies  Patient Measurements: Height: 6' (182.9 cm) Weight: 122.3 kg (269 lb 11.2 oz) IBW/kg (Calculated) : 77.6 Heparin Dosing Weight: 103.3 kg  Vital Signs: Temp: 97.8 F (36.6 C) (12/03 0726) Temp Source: Oral (12/03 0726) BP: 176/103 (12/03 0726) Pulse Rate: 66 (12/03 0726)  Labs: Recent Labs    11/29/20 1426 11/29/20 1641 11/29/20 2244 11/30/20 0733  HGB 16.5  --   --   --   HCT 46.0  --   --   --   PLT 193  --   --   --   APTT  --  31  --   --   LABPROT  --  11.5  --   --   INR  --  0.9  --   --   HEPARINUNFRC  --   --  0.14* 0.32  CREATININE 1.04  --   --   --   TROPONINIHS 125* 143*  --   --     Estimated Creatinine Clearance: 112.2 mL/min (by C-G formula based on SCr of 1.04 mg/dL).   Medications:  Scheduled:  . atorvastatin  40 mg Oral QHS  . hydrochlorothiazide  25 mg Oral Daily  . lisinopril  10 mg Oral Daily    Assessment: Plan to start heparin drip for ACS/NSTEMI. No active interacting medications or allergies. H/H and Plts WNL. Trop-HS 125ng/L on admit.  12/2 2244 HL  = 0.14,  3000 units IV X 1 bolus and increased drip rate to 1700 units/hr.  12/3 0733 HL = 0.32, therapeutic x 1  Goal of Therapy:  Heparin level 0.3-0.7 units/ml Monitor platelets by anticoagulation protocol: Yes   Plan:  HL therapeutic x 1, will continue drip at 1700 units/hr and recheck level in 6 hours and CBC daily while on heparin  Sharen Hones, PharmD, BCPS 11/30/2020,8:30 AM

## 2020-12-17 DIAGNOSIS — I10 Essential (primary) hypertension: Secondary | ICD-10-CM | POA: Insufficient documentation

## 2021-01-11 ENCOUNTER — Other Ambulatory Visit: Payer: Self-pay

## 2021-01-11 ENCOUNTER — Emergency Department: Payer: No Typology Code available for payment source

## 2021-01-11 ENCOUNTER — Inpatient Hospital Stay
Admission: EM | Admit: 2021-01-11 | Discharge: 2021-01-14 | DRG: 282 | Disposition: A | Discharge status: Home / Self Care | Payer: No Typology Code available for payment source | Attending: Family Medicine | Admitting: Family Medicine

## 2021-01-11 DIAGNOSIS — E669 Obesity, unspecified: Secondary | ICD-10-CM | POA: Diagnosis present

## 2021-01-11 DIAGNOSIS — I2582 Chronic total occlusion of coronary artery: Secondary | ICD-10-CM | POA: Diagnosis present

## 2021-01-11 DIAGNOSIS — I10 Essential (primary) hypertension: Secondary | ICD-10-CM | POA: Diagnosis present

## 2021-01-11 DIAGNOSIS — Z79899 Other long term (current) drug therapy: Secondary | ICD-10-CM

## 2021-01-11 DIAGNOSIS — I2510 Atherosclerotic heart disease of native coronary artery without angina pectoris: Secondary | ICD-10-CM | POA: Diagnosis present

## 2021-01-11 DIAGNOSIS — Z91128 Patient's intentional underdosing of medication regimen for other reason: Secondary | ICD-10-CM

## 2021-01-11 DIAGNOSIS — Z20822 Contact with and (suspected) exposure to covid-19: Secondary | ICD-10-CM | POA: Diagnosis present

## 2021-01-11 DIAGNOSIS — I160 Hypertensive urgency: Secondary | ICD-10-CM | POA: Diagnosis present

## 2021-01-11 DIAGNOSIS — I214 Non-ST elevation (NSTEMI) myocardial infarction: Secondary | ICD-10-CM | POA: Diagnosis not present

## 2021-01-11 DIAGNOSIS — Z6834 Body mass index (BMI) 34.0-34.9, adult: Secondary | ICD-10-CM

## 2021-01-11 DIAGNOSIS — Z9114 Patient's other noncompliance with medication regimen: Secondary | ICD-10-CM

## 2021-01-11 DIAGNOSIS — Z87891 Personal history of nicotine dependence: Secondary | ICD-10-CM

## 2021-01-11 DIAGNOSIS — T447X6A Underdosing of beta-adrenoreceptor antagonists, initial encounter: Secondary | ICD-10-CM | POA: Diagnosis present

## 2021-01-11 LAB — BASIC METABOLIC PANEL
Anion gap: 14 (ref 5–15)
BUN: 12 mg/dL (ref 6–20)
CO2: 25 mmol/L (ref 22–32)
Calcium: 9.8 mg/dL (ref 8.9–10.3)
Chloride: 99 mmol/L (ref 98–111)
Creatinine, Ser: 0.97 mg/dL (ref 0.61–1.24)
GFR, Estimated: >60 mL/min (ref >60)
Glucose, Bld: 114 mg/dL — ABNORMAL HIGH (ref 70–99)
Potassium: 4.1 mmol/L (ref 3.5–5.1)
Sodium: 138 mmol/L (ref 135–145)

## 2021-01-11 LAB — CBC
HCT: 46.1 % (ref 39.0–52.0)
Hemoglobin: 16.4 g/dL (ref 13.0–17.0)
MCH: 31.4 pg (ref 26.0–34.0)
MCHC: 35.6 g/dL (ref 30.0–36.0)
MCV: 88.1 fL (ref 80.0–100.0)
Platelets: 275 10*3/uL (ref 150–400)
RBC: 5.23 MIL/uL (ref 4.22–5.81)
RDW: 12.5 % (ref 11.5–15.5)
WBC: 7.2 10*3/uL (ref 4.0–10.5)
nRBC: 0 % (ref 0.0–0.2)

## 2021-01-11 LAB — RESP PANEL BY RT-PCR (FLU A&B, COVID) ARPGX2
Influenza A by PCR: NEGATIVE
Influenza B by PCR: NEGATIVE
SARS Coronavirus 2 by RT PCR: NEGATIVE

## 2021-01-11 LAB — TROPONIN I (HIGH SENSITIVITY)
Troponin I (High Sensitivity): 482 ng/L (ref <18)
Troponin I (High Sensitivity): 438 ng/L (ref <18)

## 2021-01-11 MED ORDER — HEPARIN BOLUS VIA INFUSION
4000.0000 [IU] | Freq: Once | INTRAVENOUS | Status: AC
  Administered 2021-01-12: 4000 [IU] via INTRAVENOUS
  Filled 2021-01-11: qty 4000

## 2021-01-11 MED ORDER — IOHEXOL 350 MG/ML SOLN
125.0000 mL | Freq: Once | INTRAVENOUS | Status: AC | PRN
  Administered 2021-01-11: 125 mL via INTRAVENOUS

## 2021-01-11 MED ORDER — NITROGLYCERIN 0.4 MG SL SUBL: 0.4000 mg | SUBLINGUAL_TABLET | SUBLINGUAL | Status: DC | PRN

## 2021-01-11 MED ORDER — NITROGLYCERIN 0.4 MG SL SUBL
0.4000 mg | SUBLINGUAL_TABLET | SUBLINGUAL | Status: DC | PRN
  Administered 2021-01-11 – 2021-01-12 (×5): 0.4 mg via SUBLINGUAL
  Filled 2021-01-11 (×3): qty 1

## 2021-01-11 MED ORDER — METOPROLOL SUCCINATE ER 50 MG PO TB24
25.0000 mg | ORAL_TABLET | Freq: Every day | ORAL | Status: DC
  Filled 2021-01-11: qty 1

## 2021-01-11 MED ORDER — HEPARIN (PORCINE) 25000 UT/250ML-% IV SOLN
2000.0000 [IU]/h | INTRAVENOUS | Status: DC
  Administered 2021-01-12: 1450 [IU]/h via INTRAVENOUS
  Administered 2021-01-13: 2000 [IU]/h via INTRAVENOUS
  Administered 2021-01-13: 1900 [IU]/h via INTRAVENOUS
  Filled 2021-01-11 (×6): qty 250

## 2021-01-11 MED ORDER — HYDROCHLOROTHIAZIDE 25 MG PO TABS: 25.0000 mg | ORAL_TABLET | Freq: Every day | ORAL | Status: DC

## 2021-01-11 MED ORDER — MORPHINE SULFATE (PF) 4 MG/ML IV SOLN
4.0000 mg | Freq: Once | INTRAVENOUS | Status: AC
  Administered 2021-01-11: 4 mg via INTRAVENOUS
  Filled 2021-01-11: qty 1

## 2021-01-11 MED ORDER — AMLODIPINE BESYLATE 5 MG PO TABS
10.0000 mg | ORAL_TABLET | Freq: Every day | ORAL | Status: DC
Start: 2021-01-11 — End: 2021-01-13
  Administered 2021-01-12 – 2021-01-13 (×3): 10 mg via ORAL
  Filled 2021-01-11 (×3): qty 2

## 2021-01-11 MED ORDER — HYDROMORPHONE HCL 1 MG/ML IJ SOLN
1.0000 mg | Freq: Once | INTRAMUSCULAR | Status: AC
  Administered 2021-01-11: 1 mg via INTRAVENOUS
  Filled 2021-01-11: qty 1

## 2021-01-11 MED ORDER — ASPIRIN EC 81 MG PO TBEC
81.0000 mg | DELAYED_RELEASE_TABLET | Freq: Every day | ORAL | Status: DC
  Administered 2021-01-12 – 2021-01-13 (×2): 81 mg via ORAL
  Filled 2021-01-11 (×3): qty 1

## 2021-01-11 MED ORDER — HYDROMORPHONE HCL 1 MG/ML IJ SOLN
1.0000 mg | INTRAMUSCULAR | Status: DC | PRN
  Administered 2021-01-11 – 2021-01-14 (×14): 1 mg via INTRAVENOUS
  Filled 2021-01-11 (×13): qty 1

## 2021-01-11 MED ORDER — HYDRALAZINE HCL 20 MG/ML IJ SOLN
10.0000 mg | Freq: Four times a day (QID) | INTRAMUSCULAR | Status: DC | PRN
  Administered 2021-01-12: 10 mg via INTRAVENOUS
  Filled 2021-01-11: qty 1

## 2021-01-11 MED ORDER — ATORVASTATIN CALCIUM 20 MG PO TABS
40.0000 mg | ORAL_TABLET | Freq: Every day | ORAL | Status: DC
  Administered 2021-01-12 – 2021-01-13 (×2): 40 mg via ORAL
  Filled 2021-01-11 (×2): qty 2

## 2021-01-11 MED ORDER — METOPROLOL TARTRATE 5 MG/5ML IV SOLN
5.0000 mg | Freq: Once | INTRAVENOUS | Status: AC
  Administered 2021-01-11: 5 mg via INTRAVENOUS
  Filled 2021-01-11: qty 5

## 2021-01-11 MED ORDER — ONDANSETRON HCL 4 MG/2ML IJ SOLN
4.0000 mg | Freq: Once | INTRAMUSCULAR | Status: AC
  Administered 2021-01-11: 4 mg via INTRAVENOUS
  Filled 2021-01-11: qty 2

## 2021-01-11 MED ORDER — ASPIRIN 81 MG PO CHEW
324.0000 mg | CHEWABLE_TABLET | Freq: Once | ORAL | Status: AC
  Administered 2021-01-11: 324 mg via ORAL
  Filled 2021-01-11: qty 4

## 2021-01-11 MED ORDER — ONDANSETRON HCL 4 MG/2ML IJ SOLN: 4.0000 mg | Freq: Four times a day (QID) | INTRAMUSCULAR | Status: DC | PRN

## 2021-01-11 MED ORDER — ACETAMINOPHEN 325 MG PO TABS
650.0000 mg | ORAL_TABLET | ORAL | Status: DC | PRN
  Filled 2021-01-11: qty 2

## 2021-01-11 MED ORDER — LISINOPRIL 10 MG PO TABS
10.0000 mg | ORAL_TABLET | Freq: Every day | ORAL | Status: DC
  Administered 2021-01-12 – 2021-01-13 (×2): 10 mg via ORAL
  Filled 2021-01-11 (×3): qty 1

## 2021-01-11 NOTE — ED Provider Notes (Signed)
[facility] Emergency Department Provider Note  ____________________________________________  Time seen: Approximately 7:23 PM  I have reviewed the triage vital signs and the nursing notes.   HISTORY  Chief Complaint Chest Pain    HPI Willie Rose is a 55 y.o. male with a history of hypertension who comes ED complaining of central chest pain that started at 3:30 PM today.  Constant, radiating to left shoulder and bilateral arms.  Associated with shortness of breath diaphoresis and nausea.  Worse with exertion.  10/10 in intensity currently.  No dizziness or syncope.  Reports he also takes once daily metoprolol 25 mg, missed a few doses recently but took it yesterday and today.      Past Medical History:  Diagnosis Date  . Hypertension      Patient Active Problem List   Diagnosis Date Noted  . Chest pain at rest 11/29/2020  . Hypertensive urgency 11/29/2020  . Obesity with body mass index (BMI) of 30.0 to 39.9 11/29/2020  . Shortness of breath 11/29/2020     Past Surgical History:  Procedure Laterality Date  . APPENDECTOMY       Prior to Admission medications   Medication Sig Start Date End Date Taking? Authorizing Provider  cyclobenzaprine (FLEXERIL) 10 MG tablet Take 10 mg by mouth 3 (three) times daily as needed. 05/25/20   [provider]  hydrochlorothiazide (HYDRODIURIL) 25 MG tablet Take 1 tablet (25 mg total) by mouth daily. 12/01/20   Lurene Shadow, MD  lisinopril (ZESTRIL) 10 MG tablet Take 1 tablet (10 mg total) by mouth daily. 12/01/20   Lurene Shadow, MD     Allergies Patient has no known allergies.   No family history on file.  Social History Social History   Tobacco Use  . Smoking status: Former Games developer  . Smokeless tobacco: Former Engineer, water Use Topics  . Alcohol use: Yes  . Drug use: Never    Review of Systems  Constitutional:   No fever or chills.  ENT:   No sore throat. No  rhinorrhea. Cardiovascular: Positive chest pain as above without syncope. Respiratory: Positive shortness of breath without cough. Gastrointestinal:   Negative for abdominal pain, vomiting and diarrhea.  Musculoskeletal:   Negative for focal pain or swelling All other systems reviewed and are negative except as documented above in ROS and HPI.  ____________________________________________   PHYSICAL EXAM:  VITAL SIGNS: ED Triage Vitals  Enc Vitals Group     BP 01/11/21 1740 (!) 183/123     Pulse Rate 01/11/21 1740 (!) 114     Resp 01/11/21 1739 (!) 22     Temp 01/11/21 1739 98 F (36.7 C)     Temp Source 01/11/21 1739 Oral     SpO2 01/11/21 1739 100 %     Weight 01/11/21 1740 256 lb (116.1 kg)     Height 01/11/21 1740 6' (1.829 m)     Head Circumference --      Peak Flow --      Pain Score 01/11/21 1740 8     Pain Loc --      Pain Edu? --      Excl. in GC? --     Vital signs reviewed, nursing assessments reviewed.   Constitutional:   Alert and oriented. Non-toxic appearance. Eyes:   Conjunctivae are normal. EOMI. PERRL. ENT      Head:   Normocephalic and atraumatic.      Nose:   Wearing a mask.  Mouth/Throat:   Wearing a mask.      Neck:   No meningismus. Full ROM. Hematological/Lymphatic/Immunilogical:   No cervical lymphadenopathy. Cardiovascular:   RRR. Symmetric bilateral radial and DP pulses.  No murmurs. Cap refill less than 2 seconds. Respiratory:   Normal respiratory effort without tachypnea/retractions. Breath sounds are clear and equal bilaterally. No wheezes/rales/rhonchi. Gastrointestinal:   Soft and nontender. Non distended. There is no CVA tenderness.  No rebound, rigidity, or guarding.  Musculoskeletal:   Normal range of motion in all extremities. No joint effusions.  No lower extremity tenderness.  No edema. Neurologic:   Normal speech and language.  Motor grossly intact. No acute focal neurologic deficits are appreciated.  Skin:    Skin is  warm, dry and intact. No rash noted.  No petechiae, purpura, or bullae.  ____________________________________________    LABS (pertinent positives/negatives) (all labs ordered are listed, but only abnormal results are displayed) Labs Reviewed  BASIC METABOLIC PANEL - Abnormal; Notable for the following components:      Result Value   Glucose, Bld 114 (*)    All other components within normal limits  TROPONIN I (HIGH SENSITIVITY) - Abnormal; Notable for the following components:   Troponin I (High Sensitivity) 482 (*)    All other components within normal limits  RESP PANEL BY RT-PCR (FLU A&B, COVID) ARPGX2  CBC  TROPONIN I (HIGH SENSITIVITY)   ____________________________________________   EKG  Interpreted by me Sinus tachycardia rate 115.  Normal axis and intervals.  Poor R wave progression.  Very slight ST depression in lateral leads, no ST elevation.  Normal T waves.  ____________________________________________    RADIOLOGY  DG Chest 2 View  Result Date: 01/11/2021 CLINICAL DATA:  Chest pain, radiating across chest down into arm. EXAM: CHEST - 2 VIEW COMPARISON:  Chest x-ray 11/29/2020. FINDINGS: The heart size and mediastinal contours are within normal limits. Linear atelectasis versus scarring again noted within the left lower lung zone. Nipple shadows overlie bilateral lower lungs. No focal consolidation. No pulmonary edema. No pleural effusion. No pneumothorax. No acute osseous abnormality. IMPRESSION: No active cardiopulmonary disease. Electronically Signed   By: Tish Frederickson M.D.   On: 01/11/2021 18:26    ____________________________________________   PROCEDURES .Critical Care Performed by: Sharman Cheek, MD Authorized by: Sharman Cheek, MD   Critical care provider statement:    Critical care time (minutes):  35   Critical care time was exclusive of:  Separately billable procedures and treating other patients   Critical care was necessary to treat  or prevent imminent or life-threatening deterioration of the following conditions:  Cardiac failure   Critical care was time spent personally by me on the following activities:  Development of treatment plan with patient or surrogate, discussions with consultants, evaluation of patient's response to treatment, examination of patient, obtaining history from patient or surrogate, ordering and performing treatments and interventions, ordering and review of laboratory studies, ordering and review of radiographic studies, pulse oximetry, re-evaluation of patient's condition and review of old charts    ____________________________________________ Differential diagnosis NSTEMI, pericarditis, pneumothorax, pneumonia, pleural effusion   CLINICAL IMPRESSION / ASSESSMENT AND PLAN / ED COURSE  Medications ordered in the ED: Medications  nitroGLYCERIN (NITROSTAT) SL tablet 0.4 mg (0.4 mg Sublingual Given 01/11/21 1849)  metoprolol tartrate (LOPRESSOR) injection 5 mg (has no administration in time range)  aspirin chewable tablet 324 mg (324 mg Oral Given 01/11/21 1849)  morphine 4 MG/ML injection 4 mg (4 mg Intravenous Given 01/11/21 1848)  ondansetron [facility]) injection 4 mg (4 mg Intravenous Given 01/11/21 1848)    Pertinent labs & imaging results that were available during my care of the patient were reviewed by me and considered in my medical decision making (see chart for details).  Willie Rose was evaluated in Emergency Department on 01/11/2021 for the symptoms described in the history of present illness. He was evaluated in the context of the global COVID-19 pandemic, which necessitated consideration that the patient might be at risk for infection with the SARS-CoV-2 virus that causes COVID-19. Institutional protocols and algorithms that pertain to the evaluation of patients at risk for COVID-19 are in a state of rapid change based on information released by regulatory bodies including the CDC and  federal and state organizations. These policies and algorithms were followed during the patient's care in the ED.   Patient presents with central chest pain worrisome for ACS.  Doubt dissection or PE.  EKG negative for STEMI, chest x-ray viewed and interpreted by me, unremarkable.  Radiology report confirms.  Troponin is elevated at 480 compared to a previous baseline of 140, consistent with non-STEMI.  We will give nitroglycerin with goal of pain resolution, IV morphine, aspirin, heparin infusion and plan to admit.  Patient is also tachycardic with a heart rate of about 120.  We will give a dose of IV metoprolol to try to decrease myocardial oxygen demand.  Clinical Course as of 01/11/21 2206  Fri Jan 11, 2021  2029 Repeat EKG shows no interval change.  Heart rate improved to 95, blood pressure improved to 177/100. [PS]  2205 CTA negative for dissection or other acute issue.  Heparin ordered.  Admit to hospitalist for NSTEMI. [PS]    Clinical Course User Index [PS] Sharman Cheek, MD     ____________________________________________   FINAL CLINICAL IMPRESSION(S) / ED DIAGNOSES    Final diagnoses:  NSTEMI (non-ST elevated myocardial infarction) [facility])     ED Discharge Orders    None      Portions of this note were generated with dragon dictation software. Dictation errors may occur despite best attempts at proofreading.   Sharman Cheek, MD 01/11/21 Serena Croissant

## 2021-01-11 NOTE — ED Triage Notes (Signed)
CP that started approx 2 hours ago at rest. Pain that radiates across chest and down into right arm. Reports SOB since chest pain began.   Pt alert and oriented X4, cooperative, RR even and unlabored, color WNL. Pt in NAD.

## 2021-01-11 NOTE — ED Notes (Signed)
Informed by patient that he took home med metoprolol at approx 1130. Holding ordered amlodipine for 30 minutes.

## 2021-01-11 NOTE — ED Notes (Signed)
Received verbal orders from dr Scotty Court for one more nitroglycerin tablet at this time.

## 2021-01-11 NOTE — H&P (Addendum)
History and Physical    RUSHTON EARLY JTT:[id number] DOB: Nov 20, 1966 DOA: 01/11/2021  PCP: Patient, No Pcp Per   Patient coming from: Home  I have personally briefly reviewed patient's old medical records in [facility]  Chief Complaint: Chest pain  HPI: Willie Rose is a 55 y.o. male with medical history significant for hypertension, who presents to the emergency room for evaluation of chest pain that started a few hours prior to arrival.  He describes the pain as a tightness in the retrosternal area but radiating to the left and to the right and to the back.  He has no associated nausea, vomiting or diaphoresis and no associated shortness of breath, lightheadedness or palpitations.  Patient was hospitalized a month prior with chest pain and uncontrolled hypertension.  He had a low risk stress test.  Patient denies cough, shortness of breath, fever and chills ED Course: On arrival, blood pressure was 183/123, pulse 114, respirations 22 with O2 sat 100% on room air respirations 22.  Troponin  [id number].  Other labs within normal limits.  COVID and flu negative-  EKG as reviewed by me : NSR at 91 with nonspecific ST-T wave changes Imaging: Chest x-ray was nonacute.  CTA negative for aortic abnormality  Patient started on heparin infusion for possible NSTEMI.  Hospitalist consulted for admission.  Review of Systems: As per HPI otherwise all other systems on review of systems negative.    Past Medical History:  Diagnosis Date  . Hypertension     Past Surgical History:  Procedure Laterality Date  . APPENDECTOMY       reports that he has quit smoking. He has quit using smokeless tobacco. He reports current alcohol use. He reports that he does not use drugs.  No Known Allergies  History reviewed. No pertinent family history.    Prior to Admission medications   Medication Sig Start Date End Date Taking? Authorizing Provider  albuterol (VENTOLIN HFA) 108 (90 Base) MCG/ACT  inhaler Inhale 1 puff into the lungs every 4 (four) hours as needed. 12/07/20  Yes [provider]  cyclobenzaprine (FLEXERIL) 10 MG tablet Take 10 mg by mouth 3 (three) times daily as needed. 05/25/20  Yes [provider]  lisinopril (ZESTRIL) 10 MG tablet Take 1 tablet (10 mg total) by mouth daily. 12/01/20  Yes Lurene Shadow, MD  metoprolol succinate (TOPROL-XL) 25 MG 24 hr tablet Take 25 mg by mouth daily. 12/18/20  Yes [provider]  hydrochlorothiazide (HYDRODIURIL) 25 MG tablet Take 1 tablet (25 mg total) by mouth daily. Patient not taking: No sig reported 12/01/20   Lurene Shadow, MD    Physical Exam: Vitals:   01/11/21 1740 01/11/21 1830 01/11/21 1939 01/11/21 2032  BP: (!) 183/123 (!) 208/127 (!) 177/114 (!) 189/114  Pulse: (!) 114 (!) 112  98  Resp:  19  18  Temp: 98 F (36.7 C)     TempSrc: Oral     SpO2:  98%  96%  Weight: 116.1 kg     Height: 6' (1.829 m)        Vitals:   01/11/21 1740 01/11/21 1830 01/11/21 1939 01/11/21 2032  BP: (!) 183/123 (!) 208/127 (!) 177/114 (!) 189/114  Pulse: (!) 114 (!) 112  98  Resp:  19  18  Temp: 98 F (36.7 C)     TempSrc: Oral     SpO2:  98%  96%  Weight: 116.1 kg     Height: 6' (1.829  m)         Constitutional: Alert and oriented x 3 . Not in any apparent distress HEENT:      Head: Normocephalic and atraumatic.         Eyes: PERLA, EOMI, Conjunctivae are normal. Sclera is non-icteric.       Mouth/Throat: Mucous membranes are moist.       Neck: Supple with no signs of meningismus. Cardiovascular: Regular rate and rhythm. No murmurs, gallops, or rubs. 2+ symmetrical distal pulses are present . No JVD. No LE edema Respiratory: Respiratory effort normal .Lungs sounds clear bilaterally. No wheezes, crackles, or rhonchi.  Gastrointestinal: Soft, non tender, and non distended with positive bowel sounds.  Genitourinary: No CVA tenderness. Musculoskeletal: Nontender with normal range of motion in all  extremities. No cyanosis, or erythema of extremities. Neurologic:  Face is symmetric. Moving all extremities. No gross focal neurologic deficits . Skin: Skin is warm, dry.  No rash or ulcers Psychiatric: Mood and affect are normal    Labs on Admission: I have personally reviewed following labs and imaging studies  CBC: Recent Labs  Lab 01/11/21 1741  WBC 7.2  HGB 16.4  HCT 46.1  MCV 88.1  PLT 275   Basic Metabolic Panel: Recent Labs  Lab 01/11/21 1741  NA 138  K 4.1  CL 99  CO2 25  GLUCOSE 114*  BUN 12  CREATININE 0.97  CALCIUM 9.8   GFR: Estimated Creatinine Clearance: 117.2 mL/min (by C-G formula based on SCr of 0.97 mg/dL). Liver Function Tests: No results for input(s): AST, ALT, ALKPHOS, BILITOT, PROT, ALBUMIN in the last 168 hours. No results for input(s): LIPASE, AMYLASE in the last 168 hours. No results for input(s): AMMONIA in the last 168 hours. Coagulation Profile: No results for input(s): INR, PROTIME in the last 168 hours. Cardiac Enzymes: No results for input(s): CKTOTAL, CKMB, CKMBINDEX, TROPONINI in the last 168 hours. BNP (last 3 results) No results for input(s): PROBNP in the last 8760 hours. HbA1C: No results for input(s): HGBA1C in the last 72 hours. CBG: No results for input(s): GLUCAP in the last 168 hours. Lipid Profile: No results for input(s): CHOL, HDL, LDLCALC, TRIG, CHOLHDL, LDLDIRECT in the last 72 hours. Thyroid Function Tests: No results for input(s): TSH, T4TOTAL, FREET4, T3FREE, THYROIDAB in the last 72 hours. Anemia Panel: No results for input(s): VITAMINB12, FOLATE, FERRITIN, TIBC, IRON, RETICCTPCT in the last 72 hours. Urine analysis: No results found for: COLORURINE, APPEARANCEUR, LABSPEC, PHURINE, GLUCOSEU, HGBUR, BILIRUBINUR, KETONESUR, PROTEINUR, UROBILINOGEN, NITRITE, LEUKOCYTESUR  Radiological Exams on Admission: DG Chest 2 View  Result Date: 01/11/2021 CLINICAL DATA:  Chest pain, radiating across chest down into arm.  EXAM: CHEST - 2 VIEW COMPARISON:  Chest x-ray 11/29/2020. FINDINGS: The heart size and mediastinal contours are within normal limits. Linear atelectasis versus scarring again noted within the left lower lung zone. Nipple shadows overlie bilateral lower lungs. No focal consolidation. No pulmonary edema. No pleural effusion. No pneumothorax. No acute osseous abnormality. IMPRESSION: No active cardiopulmonary disease. Electronically Signed   By: Tish FredericksonMorgane  Naveau M.D.   On: 01/11/2021 18:26   CT Angio Chest/Abd/Pel for Dissection W and/or Wo Contrast  Result Date: 01/11/2021 CLINICAL DATA:  Chest pain and back pain EXAM: CT ANGIOGRAPHY CHEST, ABDOMEN AND PELVIS TECHNIQUE: Non-contrast CT of the chest was initially obtained. Multidetector CT imaging through the chest, abdomen and pelvis was performed using the standard protocol during bolus administration of intravenous contrast. Multiplanar reconstructed images and MIPs were obtained and reviewed  to evaluate the vascular anatomy. CONTRAST:  OMNIPAQUE IOHEXOL 350 MG/ML SOLN COMPARISON:  None. FINDINGS: CTA CHEST FINDINGS Cardiovascular: --Heart: The heart size is normal.  There is nopericardial effusion. --Aorta: The course and caliber of the thoracic aorta are normal. There is no aortic atherosclerotic calcification. Precontrast images show no aortic intramural hematoma. There is no blood pool, dissection or penetrating ulcer demonstrated on arterial phase postcontrast imaging. There is a conventional 3 vessel aortic arch branching pattern. The proximal arch vessels are widely patent. --Pulmonary Arteries: Contrast timing is optimized for preferential opacification of the aorta. Within that limitation, normal central pulmonary arteries. Mediastinum/Nodes: No mediastinal, hilar or axillary lymphadenopathy. The visualized thyroid and thoracic esophageal course are unremarkable. Lungs/Pleura: Streaky atelectasis seen at both lung bases. For for no pulmonary  nodules or masses. No pleural effusion or pneumothorax. No focal airspace consolidation. No focal pleural abnormality. Musculoskeletal: No chest wall abnormality. No acute osseous findings. Review of the MIP images confirms the above findings. CTA ABDOMEN AND PELVIS FINDINGS VASCULAR Aorta: There is a tiny focal area of ectasia is seen within the infrarenal abdominal aorta measuring 2.5 cm, series 5, image 48. No aneurysmal dilatation is seen. There is mild scattered aortic atherosclerosis. Celiac: No aneurysm, dissection or hemodynamically significant stenosis. Normal branching pattern SMA: Widely patent without dissection or stenosis. Renals: Single renal arteries bilaterally. No aneurysm, dissection, stenosis or evidence of fibromuscular dysplasia. IMA: Patent without abnormality. Inflow: No aneurysm, stenosis or dissection. Scattered atherosclerosis is noted. Veins: Normal course and caliber of the major veins. Assessment is otherwise limited by the arterial dominant contrast phase. Review of the MIP images confirms the above findings. NON-VASCULAR Hepatobiliary: Normal hepatic contours and density. No visible biliary dilatation. Normal gallbladder. Pancreas: Normal contours without ductal dilatation. No peripancreatic fluid collection. Spleen: Normal arterial phase splenic enhancement pattern. Adrenals/Urinary Tract: --Adrenal glands: Normal. Kidneys: There is a small a 1 cm low-density lesion seen within the upper pole of the left kidney. There is mild perinephric stranding seen within the posterior upper pole of the left kidney which is nonspecific. No renal or collecting system calculi. --Urinary bladder: Unremarkable. Stomach/Bowel: --Stomach/Duodenum: No hiatal hernia or other gastric abnormality. Normal duodenal course and caliber. --Small bowel: No dilatation or inflammation. --Colon: No focal abnormality. --Appendix: Normal. Lymphatic:  No abdominal or pelvic lymphadenopathy. Reproductive: No free fluid  in the pelvis. Musculoskeletal. No bony spinal canal stenosis or focal osseous abnormality. 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 Degenerative changes are seen at L5-S1 with severe left and moderate right neural foraminal narrowing. Other: None. Review of the MIP images confirms the above findings. IMPRESSION: 1. No acute aortic abnormality. 2. 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 No follow-up recommended. This recommendation follows ACR consensus guidelines: White Paper of the ACR Incidental Findings Committee II on Vascular Findings. J Am Coll Radiol 2013; 10:789-794. 3. Mild left-sided perinephric stranding which is nonspecific may be due to infectious or inflammatory process. 4.  Aortic Atherosclerosis (ICD10-I70.0). 5. Degenerative changes in the lower lumbar spine most notable at L5-S1. Electronically Signed   By: Jonna Clark M.D.   On: 01/11/2021 22:01     Assessment/Plan 55 year old male with history of uncontrolled hypertension and recent hospitalization for chest pain with low risk stress test presenting with recurrent chest pain    NSTEMI (non-ST elevated myocardial infarction) (HCC) - Patient with chest pain, elevated troponin though with flat the downward trend of 482>438, and with recent lower stress  test in December 2021 - Possibility that this is demand ischemia from uncontrolled hypertension - We will continue heparin infusion for now - Aspirin, statin, beta-blocker.  Nitroglycerin sublingual as needed chest pain with morphine for breakthrough - Blood pressure control - Cardiology consult    Hypertensive urgency - Blood pressure 183/123 on arrival - Patient got a dose of IV metoprolol in the emergency room but BP remains elevated - Continue home lisinopril and metoprolol.  Amlodipine added - Hydralazine IV as needed     Obesity with body mass index (BMI) of 30.0 to 39.9 - Dietary and lifestyle counseling    DVT  prophylaxis: Full dose heparin Code Status: full code  Family Communication:  none  Disposition Plan: Back to previous home environment Consults called: Cardiology Status: Observation    Andris Baumann MD [facility]     01/11/2021, 10:30 PM

## 2021-01-12 DIAGNOSIS — Z20822 Contact with and (suspected) exposure to covid-19: Secondary | ICD-10-CM | POA: Diagnosis present

## 2021-01-12 DIAGNOSIS — I10 Essential (primary) hypertension: Secondary | ICD-10-CM | POA: Diagnosis present

## 2021-01-12 DIAGNOSIS — Z9114 Patient's other noncompliance with medication regimen: Secondary | ICD-10-CM | POA: Diagnosis not present

## 2021-01-12 DIAGNOSIS — I214 Non-ST elevation (NSTEMI) myocardial infarction: Secondary | ICD-10-CM | POA: Diagnosis present

## 2021-01-12 DIAGNOSIS — T447X6A Underdosing of beta-adrenoreceptor antagonists, initial encounter: Secondary | ICD-10-CM | POA: Diagnosis present

## 2021-01-12 DIAGNOSIS — Z6834 Body mass index (BMI) 34.0-34.9, adult: Secondary | ICD-10-CM | POA: Diagnosis not present

## 2021-01-12 DIAGNOSIS — I2582 Chronic total occlusion of coronary artery: Secondary | ICD-10-CM | POA: Diagnosis present

## 2021-01-12 DIAGNOSIS — I2510 Atherosclerotic heart disease of native coronary artery without angina pectoris: Secondary | ICD-10-CM | POA: Diagnosis present

## 2021-01-12 DIAGNOSIS — Z87891 Personal history of nicotine dependence: Secondary | ICD-10-CM | POA: Diagnosis not present

## 2021-01-12 DIAGNOSIS — Z91128 Patient's intentional underdosing of medication regimen for other reason: Secondary | ICD-10-CM | POA: Diagnosis not present

## 2021-01-12 DIAGNOSIS — I160 Hypertensive urgency: Secondary | ICD-10-CM | POA: Diagnosis present

## 2021-01-12 DIAGNOSIS — Z79899 Other long term (current) drug therapy: Secondary | ICD-10-CM | POA: Diagnosis not present

## 2021-01-12 DIAGNOSIS — E669 Obesity, unspecified: Secondary | ICD-10-CM | POA: Diagnosis present

## 2021-01-12 LAB — CBC
HCT: 49 % (ref 39.0–52.0)
Hemoglobin: 17.4 g/dL — ABNORMAL HIGH (ref 13.0–17.0)
MCH: 31.2 pg (ref 26.0–34.0)
MCHC: 35.5 g/dL (ref 30.0–36.0)
MCV: 88 fL (ref 80.0–100.0)
Platelets: 262 10*3/uL (ref 150–400)
RBC: 5.57 MIL/uL (ref 4.22–5.81)
RDW: 12.4 % (ref 11.5–15.5)
WBC: 9.4 10*3/uL (ref 4.0–10.5)
nRBC: 0 % (ref 0.0–0.2)

## 2021-01-12 LAB — LIPID PANEL
Cholesterol: 212 mg/dL — ABNORMAL HIGH (ref 0–200)
HDL: 37 mg/dL — ABNORMAL LOW (ref >40)
LDL Cholesterol: 137 mg/dL — ABNORMAL HIGH (ref 0–99)
Total CHOL/HDL Ratio: 5.7 RATIO
Triglycerides: 190 mg/dL — ABNORMAL HIGH (ref <150)
VLDL: 38 mg/dL (ref 0–40)

## 2021-01-12 LAB — BASIC METABOLIC PANEL
Anion gap: 11 (ref 5–15)
BUN: 11 mg/dL (ref 6–20)
CO2: 27 mmol/L (ref 22–32)
Calcium: 9.4 mg/dL (ref 8.9–10.3)
Chloride: 98 mmol/L (ref 98–111)
Creatinine, Ser: 0.87 mg/dL (ref 0.61–1.24)
GFR, Estimated: >60 mL/min (ref >60)
Glucose, Bld: 133 mg/dL — ABNORMAL HIGH (ref 70–99)
Potassium: 4.1 mmol/L (ref 3.5–5.1)
Sodium: 136 mmol/L (ref 135–145)

## 2021-01-12 LAB — PROTIME-INR
INR: 1.1 (ref 0.8–1.2)
Prothrombin Time: 13.7 seconds (ref 11.4–15.2)

## 2021-01-12 LAB — APTT: aPTT: 124 seconds — ABNORMAL HIGH (ref 24–36)

## 2021-01-12 LAB — HEPARIN LEVEL (UNFRACTIONATED)
Heparin Unfractionated: 0.16 IU/mL — ABNORMAL LOW (ref 0.30–0.70)
Heparin Unfractionated: 0.33 IU/mL (ref 0.30–0.70)
Heparin Unfractionated: 0.26 IU/mL — ABNORMAL LOW (ref 0.30–0.70)

## 2021-01-12 LAB — TROPONIN I (HIGH SENSITIVITY): Troponin I (High Sensitivity): 689 ng/L (ref <18)

## 2021-01-12 MED ORDER — HEPARIN BOLUS VIA INFUSION
3000.0000 [IU] | Freq: Once | INTRAVENOUS | Status: AC
  Administered 2021-01-12: 3000 [IU] via INTRAVENOUS
  Filled 2021-01-12: qty 3000

## 2021-01-12 MED ORDER — OXYCODONE-ACETAMINOPHEN 5-325 MG PO TABS
1.0000 | ORAL_TABLET | Freq: Three times a day (TID) | ORAL | Status: DC | PRN
  Administered 2021-01-12 – 2021-01-14 (×5): 1 via ORAL
  Filled 2021-01-12 (×6): qty 1

## 2021-01-12 MED ORDER — METOPROLOL SUCCINATE ER 50 MG PO TB24
50.0000 mg | ORAL_TABLET | Freq: Every day | ORAL | Status: DC
  Administered 2021-01-12 – 2021-01-13 (×2): 50 mg via ORAL
  Filled 2021-01-12 (×2): qty 1

## 2021-01-12 MED ORDER — LABETALOL HCL 5 MG/ML IV SOLN: 10.0000 mg | INTRAVENOUS | Status: DC | PRN

## 2021-01-12 MED ORDER — NITROGLYCERIN 2 % TD OINT
1.0000 [in_us] | TOPICAL_OINTMENT | Freq: Four times a day (QID) | TRANSDERMAL | Status: DC
  Administered 2021-01-12: 1 [in_us] via TOPICAL
  Filled 2021-01-12 (×2): qty 1

## 2021-01-12 MED ORDER — SODIUM CHLORIDE 0.9% FLUSH
3.0000 mL | Freq: Two times a day (BID) | INTRAVENOUS | Status: DC
  Administered 2021-01-12 – 2021-01-13 (×3): 3 mL via INTRAVENOUS

## 2021-01-12 MED ORDER — CYCLOBENZAPRINE HCL 10 MG PO TABS
10.0000 mg | ORAL_TABLET | Freq: Once | ORAL | Status: AC
  Administered 2021-01-12: 10 mg via ORAL
  Filled 2021-01-12: qty 1

## 2021-01-12 NOTE — Progress Notes (Signed)
PROGRESS NOTE    Willie Rose  YQM:[id number] DOB: 02/26/1966 DOA: 01/11/2021 PCP: Patient, No Pcp Per   Brief Narrative:  This 55 years old male with PMH significant for hypertension presenting to the emergency department with chest pain that started few hours prior to the arrival.  He described chest pain as tightness radiating towards left and the right side of the back.  Patient was hospitalized a month ago with chest pain and uncontrolled hypertension. He had a negative stress test.  Patient is found to have elevated troponins which is trending up.  Patient is admitted for non-STEMI, cardiology was consulted. Patient was started on heparin.  CTA negative for aortic abnormality and acute PE.  Patient is scheduled to have left heart cath on Monday.  Assessment & Plan:   Principal Problem:   NSTEMI (non-ST elevated myocardial infarction) [facility](HCC) Active Problems:   Hypertensive urgency   Obesity with body mass index (BMI) of 30.0 to 39.9  NSTEMI (non-ST elevated myocardial infarction) [facility](HCC) - Patient presented with chest pain, elevated troponin though with flat the downward trend of 482>438, and with recent negative stress test in December 2021 -  This could be demand ischemia from uncontrolled hypertension. -  Continue heparin GTT for now -  Continue aspirin, statin, beta-blocker.  Nitroglycerin sublingual as needed chest pain with morphine for breakthrough - Blood pressure control -  Cardiology consulted recommended continue heparin and possible left heart cath on Monday.    Hypertensive urgency : - Blood pressure 183/123 on arrival - Patient got a dose of IV metoprolol in the emergency room but BP remains elevated - Continue home lisinopril and metoprolol.  Amlodipine added - Hydralazine IV as needed     Obesity with body mass index (BMI) of 30.0 to 39.9 - Dietary and lifestyle counseling    DVT prophylaxis: Heparin gtt Code Status: Full code Family Communication: No  family at bedside Disposition Plan:   Status is: Inpatient  Remains inpatient appropriate because:Inpatient level of care appropriate due to severity of illness   Dispo: The patient is from: Home              Anticipated d/c is to: Home              Anticipated d/c date is: 2 days              Patient currently is not medically stable to d/c.   Consultants:   Cardiology  Procedures:  Antimicrobials:  Anti-infectives (From admission, onward)   None        Subjective: Patient was seen and examined at bedside.  Overnight events noted.  Patient reports chest pain is better denies any shortness of breath and dizziness.  Objective: Vitals:   01/12/21 0945 01/12/21 1042 01/12/21 1233 01/12/21 1330  BP: (!) 159/114 (!) 153/100 (!) 152/87   Pulse: (!) 103 (!) 111 (!) 105 (!) 106  Resp: 20 12 12    Temp:  97.9 F (36.6 C)    TempSrc:  Oral    SpO2: 97% 95% 94% 95%  Weight:      Height:       No intake or output data in the 24 hours ending 01/12/21 1454 Filed Weights   01/11/21 1740  Weight: 116.1 kg    Examination:  General exam: Appears calm and comfortable, not in any acute distress. Respiratory system: Clear to auscultation. Respiratory effort normal. Cardiovascular system: S1 & S2 heard, RRR. No JVD, murmurs, rubs, gallops or clicks.  No pedal edema. Gastrointestinal system: Abdomen is nondistended, soft and nontender. No organomegaly or masses felt. Normal bowel sounds heard. Central nervous system: Alert and oriented. No focal neurological deficits. Extremities: Symmetric 5 x 5 power. Skin: No rashes, lesions or ulcers Psychiatry: Judgement and insight appear normal. Mood & affect appropriate.     Data Reviewed: I have personally reviewed following labs and imaging studies  CBC: Recent Labs  Lab 01/11/21 1741 01/12/21 0820  WBC 7.2 9.4  HGB 16.4 17.4*  HCT 46.1 49.0  MCV 88.1 88.0  PLT 275 262   Basic Metabolic Panel: Recent Labs  Lab  01/11/21 1741 01/12/21 0627  NA 138 136  K 4.1 4.1  CL 99 98  CO2 25 27  GLUCOSE 114* 133*  BUN 12 11  CREATININE 0.97 0.87  CALCIUM 9.8 9.4   GFR: Estimated Creatinine Clearance: 130.7 mL/min (by C-G formula based on SCr of 0.87 mg/dL). Liver Function Tests: No results for input(s): AST, ALT, ALKPHOS, BILITOT, PROT, ALBUMIN in the last 168 hours. No results for input(s): LIPASE, AMYLASE in the last 168 hours. No results for input(s): AMMONIA in the last 168 hours. Coagulation Profile: Recent Labs  Lab 01/12/21 0217  INR 1.1   Cardiac Enzymes: No results for input(s): CKTOTAL, CKMB, CKMBINDEX, TROPONINI in the last 168 hours. BNP (last 3 results) No results for input(s): PROBNP in the last 8760 hours. HbA1C: No results for input(s): HGBA1C in the last 72 hours. CBG: No results for input(s): GLUCAP in the last 168 hours. Lipid Profile: Recent Labs    01/12/21 0627  CHOL 212*  HDL 37*  LDLCALC 137*  TRIG 190*  CHOLHDL 5.7   Thyroid Function Tests: No results for input(s): TSH, T4TOTAL, FREET4, T3FREE, THYROIDAB in the last 72 hours. Anemia Panel: No results for input(s): VITAMINB12, FOLATE, FERRITIN, TIBC, IRON, RETICCTPCT in the last 72 hours. Sepsis Labs: No results for input(s): PROCALCITON, LATICACIDVEN in the last 168 hours.  Recent Results (from the past 240 hour(s))  Resp Panel by RT-PCR (Flu A&B, Covid) Nasopharyngeal Swab     Status: None   Collection Time: 01/11/21  6:54 PM   Specimen: Nasopharyngeal Swab; Nasopharyngeal(NP) swabs in vial transport medium  Result Value Ref Range Status   SARS Coronavirus 2 by RT PCR NEGATIVE NEGATIVE Final    Comment: (NOTE) SARS-CoV-2 target nucleic acids are NOT DETECTED.  The SARS-CoV-2 RNA is generally detectable in upper respiratory specimens during the acute phase of infection. The lowest concentration of SARS-CoV-2 viral copies this assay can detect is 138 copies/mL. A negative result does not preclude  SARS-Cov-2 infection and should not be used as the sole basis for treatment or other patient management decisions. A negative result may occur with  improper specimen collection/handling, submission of specimen other than nasopharyngeal swab, presence of viral mutation(s) within the areas targeted by this assay, and inadequate number of viral copies(<138 copies/mL). A negative result must be combined with clinical observations, patient history, and epidemiological information. The expected result is Negative.  Fact Sheet for Patients:  BloggerCourse.com  Fact Sheet for Healthcare Providers:  SeriousBroker.it  This test is no t yet approved or cleared by the [place] FDA and  has been authorized for detection and/or diagnosis of SARS-CoV-2 by FDA under an Emergency Use Authorization (EUA). This EUA will remain  in effect (meaning this test can be used) for the duration of the COVID-19 declaration under Section 564(b)(1) of the Act, 21 U.S.C.section 360bbb-3(b)(1), unless the authorization is  terminated  or revoked sooner.       Influenza A by PCR NEGATIVE NEGATIVE Final   Influenza B by PCR NEGATIVE NEGATIVE Final    Comment: (NOTE) The Xpert Xpress SARS-CoV-2/FLU/RSV plus assay is intended as an aid in the diagnosis of influenza from Nasopharyngeal swab specimens and should not be used as a sole basis for treatment. Nasal washings and aspirates are unacceptable for Xpert Xpress SARS-CoV-2/FLU/RSV testing.  Fact Sheet for Patients: BloggerCourse.com  Fact Sheet for Healthcare Providers: SeriousBroker.it  This test is not yet approved or cleared by the [place] FDA and has been authorized for detection and/or diagnosis of SARS-CoV-2 by FDA under an Emergency Use Authorization (EUA). This EUA will remain in effect (meaning this test can be used) for the duration of  the COVID-19 declaration under Section 564(b)(1) of the Act, 21 U.S.C. section 360bbb-3(b)(1), unless the authorization is terminated or revoked.  Performed at [facility], [address], [place], [place] [address]     Radiology Studies: DG Chest 2 View  Result Date: 01/11/2021 CLINICAL DATA:  Chest pain, radiating across chest down into arm. EXAM: CHEST - 2 VIEW COMPARISON:  Chest x-ray 11/29/2020. FINDINGS: The heart size and mediastinal contours are within normal limits. Linear atelectasis versus scarring again noted within the left lower lung zone. Nipple shadows overlie bilateral lower lungs. No focal consolidation. No pulmonary edema. No pleural effusion. No pneumothorax. No acute osseous abnormality. IMPRESSION: No active cardiopulmonary disease. Electronically Signed   By: Tish Frederickson M.D.   On: 01/11/2021 18:26   CT Angio Chest/Abd/Pel for Dissection W and/or Wo Contrast  Result Date: 01/11/2021 CLINICAL DATA:  Chest pain and back pain EXAM: CT ANGIOGRAPHY CHEST, ABDOMEN AND PELVIS TECHNIQUE: Non-contrast CT of the chest was initially obtained. Multidetector CT imaging through the chest, abdomen and pelvis was performed using the standard protocol during bolus administration of intravenous contrast. Multiplanar reconstructed images and MIPs were obtained and reviewed to evaluate the vascular anatomy. CONTRAST:  OMNIPAQUE IOHEXOL 350 MG/ML SOLN COMPARISON:  None. FINDINGS: CTA CHEST FINDINGS Cardiovascular: --Heart: The heart size is normal.  There is nopericardial effusion. --Aorta: The course and caliber of the thoracic aorta are normal. There is no aortic atherosclerotic calcification. Precontrast images show no aortic intramural hematoma. There is no blood pool, dissection or penetrating ulcer demonstrated on arterial phase postcontrast imaging. There is a conventional 3 vessel aortic arch branching pattern. The proximal arch vessels are widely patent. --Pulmonary  Arteries: Contrast timing is optimized for preferential opacification of the aorta. Within that limitation, normal central pulmonary arteries. Mediastinum/Nodes: No mediastinal, hilar or axillary lymphadenopathy. The visualized thyroid and thoracic esophageal course are unremarkable. Lungs/Pleura: Streaky atelectasis seen at both lung bases. For for no pulmonary nodules or masses. No pleural effusion or pneumothorax. No focal airspace consolidation. No focal pleural abnormality. Musculoskeletal: No chest wall abnormality. No acute osseous findings. Review of the MIP images confirms the above findings. CTA ABDOMEN AND PELVIS FINDINGS VASCULAR Aorta: There is a tiny focal area of ectasia is seen within the infrarenal abdominal aorta measuring 2.5 cm, series 5, image 48. No aneurysmal dilatation is seen. There is mild scattered aortic atherosclerosis. Celiac: No aneurysm, dissection or hemodynamically significant stenosis. Normal branching pattern SMA: Widely patent without dissection or stenosis. Renals: Single renal arteries bilaterally. No aneurysm, dissection, stenosis or evidence of fibromuscular dysplasia. IMA: Patent without abnormality. Inflow: No aneurysm, stenosis or dissection. Scattered atherosclerosis is noted. Veins: Normal course and caliber of the major  veins. Assessment is otherwise limited by the arterial dominant contrast phase. Review of the MIP images confirms the above findings. NON-VASCULAR Hepatobiliary: Normal hepatic contours and density. No visible biliary dilatation. Normal gallbladder. Pancreas: Normal contours without ductal dilatation. No peripancreatic fluid collection. Spleen: Normal arterial phase splenic enhancement pattern. Adrenals/Urinary Tract: --Adrenal glands: Normal. Kidneys: There is a small a 1 cm low-density lesion seen within the upper pole of the left kidney. There is mild perinephric stranding seen within the posterior upper pole of the left kidney which is nonspecific.  No renal or collecting system calculi. --Urinary bladder: Unremarkable. Stomach/Bowel: --Stomach/Duodenum: No hiatal hernia or other gastric abnormality. Normal duodenal course and caliber. --Small bowel: No dilatation or inflammation. --Colon: No focal abnormality. --Appendix: Normal. Lymphatic:  No abdominal or pelvic lymphadenopathy. Reproductive: No free fluid in the pelvis. Musculoskeletal. No bony spinal canal stenosis or focal osseous abnormality. 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 Degenerative changes are seen at L5-S1 with severe left and moderate right neural foraminal narrowing. Other: None. Review of the MIP images confirms the above findings. IMPRESSION: 1. No acute aortic abnormality. 2. 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 No follow-up recommended. This recommendation follows ACR consensus guidelines: White Paper of the ACR Incidental Findings Committee II on Vascular Findings. J Am Coll Radiol 2013; 10:789-794. 3. Mild left-sided perinephric stranding which is nonspecific may be due to infectious or inflammatory process. 4.  Aortic Atherosclerosis (ICD10-I70.0). 5. Degenerative changes in the lower lumbar spine most notable at L5-S1. Electronically Signed   By: Jonna Clark M.D.   On: 01/11/2021 22:01   Scheduled Meds: . amLODipine  10 mg Oral Daily  . aspirin EC  81 mg Oral Daily  . atorvastatin  40 mg Oral q1800  . lisinopril  10 mg Oral Daily  . metoprolol succinate  50 mg Oral Daily  . nitroGLYCERIN  1 inch Topical Q6H  . sodium chloride flush  3 mL Intravenous Q12H   Continuous Infusions: . heparin 1,750 Units/hr (01/12/21 1356)     LOS: 0 days    Time spent: 35 mins.    Cipriano Bunker, MD [facility]   If 7PM-7AM, please contact night-coverage

## 2021-01-12 NOTE — ED Notes (Signed)
8/10 pain

## 2021-01-12 NOTE — Progress Notes (Signed)
ANTICOAGULATION CONSULT NOTE - Initial Consult  Pharmacy Consult for Heparin Indication: chest pain/ACS  No Known Allergies  Patient Measurements: Height: 6' (182.9 cm) Weight: 116.1 kg (256 lb) IBW/kg (Calculated) : 77.6 HEPARIN DW (KG): 102.7  Vital Signs: BP: 180/109 (01/15 0815) Pulse Rate: 113 (01/15 0815)  Labs: Recent Labs    01/11/21 1741 01/11/21 1837 01/12/21 0217 01/12/21 0627 01/12/21 0820  HGB 16.4  --   --   --  17.4*  HCT 46.1  --   --   --  49.0  PLT 275  --   --   --  262  APTT  --   --  124*  --   --   LABPROT  --   --  13.7  --   --   INR  --   --  1.1  --   --   HEPARINUNFRC  --   --   --   --  0.16*  CREATININE 0.97  --   --  0.87  --   TROPONINIHS 482* 438* 689*  --   --     Estimated Creatinine Clearance: 130.7 mL/min (by C-G formula based on SCr of 0.87 mg/dL).   Medical History: Past Medical History:  Diagnosis Date  . Hypertension    Medications:  (Not in a hospital admission)   Assessment: No anticoagulants PTA.  Baseline labs ordered.  Heparin initiated for ACS.    01/15 0820 HL= 0.16 subtherapeutic, bolus/inc drip to 1750 units/hr  Goal of Therapy:  Heparin level 0.3-0.7 units/ml Monitor platelets by anticoagulation protocol: Yes   Plan:  Heparin bolus 4000 units x 1 Heparin infusion at 1450 units/hr Check HL ~ 6 hours after heparin started  - 01/15 0820 HL= 0.16 subtherapeutic.  Will order Bolus of 3000 units x 1 and increase drip to 1750 units/hr. F/u HL in 6 hrs and CBC daily  Sharleen Szczesny A 01/12/2021,9:14 AM

## 2021-01-12 NOTE — Consult Note (Signed)
Cardiology Consultation Note    Patient ID: Willie Rose, MRN: [id number], DOB/AGE: 53/06/1966 53 y.o. Admit date: 01/11/2021   Date of Consult: 01/12/2021 Primary Physician: Patient, No Pcp Per Primary Cardiologist: none  Chief Complaint: chest pain Reason for Consultation: elevated troponin Requesting MD: Dr. Para Marchuncan  HPI: Willie Rose is a 55 y.o. male with history of hypertension who presented to emergency room complaining of chest pain.  It is constant radiating to both arms and his left shoulder.  Worse with exertion.  Is associated with shortness of breath.  States he had missed several doses of metoprolol in the past but has resumed it.  EKG showed sinus tachycardia with nonspecific ST-T wave changes.  Chest x-ray showed no acute cardiopulmonary disease.  Chest CT revealed no acute aortic abnormality.  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  She had a functional study in December 2021 showed an ejection fraction of 50% which was read as a low risk study.  Patient's initial troponin is 482 subsequently was 438 and 689.  Renal function and electrolytes were normal.  Patient was admitted with similar complaints approximately 1 month ago.  This prompted the functional study which was read as normal.  Was experience hypertensive and urgency at that time.  Patient also was noncompliant with his medications at that time.  Patient states he has been out of his metoprolol for approximately 26 hours and had resumed it.  He has continued to have intermittent episodes of midsternal chest pain.  On further discussion, patient has had several types of chest pain.  He does have some that occurs when ambulating but sometimes is able to ambulate 5 miles without too much difficulty.  Works in a fairly physical job and is usually able to carry this out without too much difficulty although occasionally will get chest discomfort with this.  He is currently pain-free.  He has had some improvement in his  pain with pain meds as well as transiently with nitrates.  EKG shows no acute injury. Past Medical History:  Diagnosis Date  . Hypertension       Surgical History:  Past Surgical History:  Procedure Laterality Date  . APPENDECTOMY       Home Meds: Prior to Admission medications   Medication Sig Start Date End Date Taking? Authorizing Provider  albuterol (VENTOLIN HFA) 108 (90 Base) MCG/ACT inhaler Inhale 1 puff into the lungs every 4 (four) hours as needed. 12/07/20  Yes [provider]  cyclobenzaprine (FLEXERIL) 10 MG tablet Take 10 mg by mouth 3 (three) times daily as needed. 05/25/20  Yes [provider]  lisinopril (ZESTRIL) 10 MG tablet Take 1 tablet (10 mg total) by mouth daily. 12/01/20  Yes Lurene ShadowAyiku, Bernard, MD  metoprolol succinate (TOPROL-XL) 25 MG 24 hr tablet Take 25 mg by mouth daily. 12/18/20  Yes [provider]  hydrochlorothiazide (HYDRODIURIL) 25 MG tablet Take 1 tablet (25 mg total) by mouth daily. Patient not taking: No sig reported 12/01/20   Lurene ShadowAyiku, Bernard, MD    Inpatient Medications:  . amLODipine  10 mg Oral Daily  . aspirin EC  81 mg Oral Daily  . atorvastatin  40 mg Oral q1800  . lisinopril  10 mg Oral Daily  . metoprolol succinate  25 mg Oral Daily   . heparin 1,450 Units/hr (01/12/21 0209)    Allergies: No Known Allergies  Social History   Socioeconomic History  . Marital status: Married    Spouse name: Not  on file  . Number of children: Not on file  . Years of education: Not on file  . Highest education level: Not on file  Occupational History  . Not on file  Tobacco Use  . Smoking status: Former Games developer  . Smokeless tobacco: Former Engineer, water and Sexual Activity  . Alcohol use: Yes  . Drug use: Never  . Sexual activity: Not on file  Other Topics Concern  . Not on file  Social History Narrative  . Not on file   Social Determinants of Health   Financial Resource Strain: Not on file  Food Insecurity:  Not on file  Transportation Needs: Not on file  Physical Activity: Not on file  Stress: Not on file  Social Connections: Not on file  Intimate Partner Violence: Not on file     History reviewed. No pertinent family history.   Review of Systems: A 12-system review of systems was performed and is negative except as noted in the HPI.  Labs: No results for input(s): CKTOTAL, CKMB, TROPONINI in the last 72 hours. Lab Results  Component Value Date   WBC 7.2 01/11/2021   HGB 16.4 01/11/2021   HCT 46.1 01/11/2021   MCV 88.1 01/11/2021   PLT 275 01/11/2021    Recent Labs  Lab 01/12/21 0627  NA 136  K 4.1  CL 98  CO2 27  BUN 11  CREATININE 0.87  CALCIUM 9.4  GLUCOSE 133*   Lab Results  Component Value Date   CHOL 212 (H) 01/12/2021   HDL 37 (L) 01/12/2021   LDLCALC 137 (H) 01/12/2021   TRIG 190 (H) 01/12/2021   No results found for: DDIMER  Radiology/Studies:  DG Chest 2 View  Result Date: 01/11/2021 CLINICAL DATA:  Chest pain, radiating across chest down into arm. EXAM: CHEST - 2 VIEW COMPARISON:  Chest x-ray 11/29/2020. FINDINGS: The heart size and mediastinal contours are within normal limits. Linear atelectasis versus scarring again noted within the left lower lung zone. Nipple shadows overlie bilateral lower lungs. No focal consolidation. No pulmonary edema. No pleural effusion. No pneumothorax. No acute osseous abnormality. IMPRESSION: No active cardiopulmonary disease. Electronically Signed   By: Tish Frederickson M.D.   On: 01/11/2021 18:26   CT Angio Chest/Abd/Pel for Dissection W and/or Wo Contrast  Result Date: 01/11/2021 CLINICAL DATA:  Chest pain and back pain EXAM: CT ANGIOGRAPHY CHEST, ABDOMEN AND PELVIS TECHNIQUE: Non-contrast CT of the chest was initially obtained. Multidetector CT imaging through the chest, abdomen and pelvis was performed using the standard protocol during bolus administration of intravenous contrast. Multiplanar reconstructed images and MIPs  were obtained and reviewed to evaluate the vascular anatomy. CONTRAST:  OMNIPAQUE IOHEXOL 350 MG/ML SOLN COMPARISON:  None. FINDINGS: CTA CHEST FINDINGS Cardiovascular: --Heart: The heart size is normal.  There is nopericardial effusion. --Aorta: The course and caliber of the thoracic aorta are normal. There is no aortic atherosclerotic calcification. Precontrast images show no aortic intramural hematoma. There is no blood pool, dissection or penetrating ulcer demonstrated on arterial phase postcontrast imaging. There is a conventional 3 vessel aortic arch branching pattern. The proximal arch vessels are widely patent. --Pulmonary Arteries: Contrast timing is optimized for preferential opacification of the aorta. Within that limitation, normal central pulmonary arteries. Mediastinum/Nodes: No mediastinal, hilar or axillary lymphadenopathy. The visualized thyroid and thoracic esophageal course are unremarkable. Lungs/Pleura: Streaky atelectasis seen at both lung bases. For for no pulmonary nodules or masses. No pleural effusion or pneumothorax. No focal airspace consolidation.  No focal pleural abnormality. Musculoskeletal: No chest wall abnormality. No acute osseous findings. Review of the MIP images confirms the above findings. CTA ABDOMEN AND PELVIS FINDINGS VASCULAR Aorta: There is a tiny focal area of ectasia is seen within the infrarenal abdominal aorta measuring 2.5 cm, series 5, image 48. No aneurysmal dilatation is seen. There is mild scattered aortic atherosclerosis. Celiac: No aneurysm, dissection or hemodynamically significant stenosis. Normal branching pattern SMA: Widely patent without dissection or stenosis. Renals: Single renal arteries bilaterally. No aneurysm, dissection, stenosis or evidence of fibromuscular dysplasia. IMA: Patent without abnormality. Inflow: No aneurysm, stenosis or dissection. Scattered atherosclerosis is noted. Veins: Normal course and caliber of the major veins. Assessment  is otherwise limited by the arterial dominant contrast phase. Review of the MIP images confirms the above findings. NON-VASCULAR Hepatobiliary: Normal hepatic contours and density. No visible biliary dilatation. Normal gallbladder. Pancreas: Normal contours without ductal dilatation. No peripancreatic fluid collection. Spleen: Normal arterial phase splenic enhancement pattern. Adrenals/Urinary Tract: --Adrenal glands: Normal. Kidneys: There is a small a 1 cm low-density lesion seen within the upper pole of the left kidney. There is mild perinephric stranding seen within the posterior upper pole of the left kidney which is nonspecific. No renal or collecting system calculi. --Urinary bladder: Unremarkable. Stomach/Bowel: --Stomach/Duodenum: No hiatal hernia or other gastric abnormality. Normal duodenal course and caliber. --Small bowel: No dilatation or inflammation. --Colon: No focal abnormality. --Appendix: Normal. Lymphatic:  No abdominal or pelvic lymphadenopathy. Reproductive: No free fluid in the pelvis. Musculoskeletal. No bony spinal canal stenosis or focal osseous abnormality. 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 Degenerative changes are seen at L5-S1 with severe left and moderate right neural foraminal narrowing. Other: None. Review of the MIP images confirms the above findings. IMPRESSION: 1. No acute aortic abnormality. 2. 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 No follow-up recommended. This recommendation follows ACR consensus guidelines: White Paper of the ACR Incidental Findings Committee II on Vascular Findings. J Am Coll Radiol 2013; 10:789-794. 3. Mild left-sided perinephric stranding which is nonspecific may be due to infectious or inflammatory process. 4.  Aortic Atherosclerosis (ICD10-I70.0). 5. Degenerative changes in the lower lumbar spine most notable at L5-S1. Electronically Signed   By: Jonna Clark M.D.   On: 01/11/2021 22:01    Wt  Readings from Last 3 Encounters:  01/11/21 116.1 kg  11/30/20 122.3 kg    EKG: Sinus tachycardia with no ischemia  Physical Exam:  Blood pressure (!) 171/109, pulse (!) 109, temperature 98 F (36.7 C), temperature source Oral, resp. rate 16, height 6' (1.829 m), weight 116.1 kg, SpO2 95 %. Body mass index is 34.72 kg/m. General: Well developed, well nourished, in no acute distress. Head: Normocephalic, atraumatic, sclera non-icteric, no xanthomas, nares are without discharge.  Neck: Negative for carotid bruits. JVD not elevated. Lungs: Clear bilaterally to auscultation without wheezes, rales, or rhonchi. Breathing is unlabored. Heart: RRR with S1 S2. No murmurs, rubs, or gallops appreciated. Abdomen: Soft, non-tender, non-distended with normoactive bowel sounds. No hepatomegaly. No rebound/guarding. No obvious abdominal masses. Msk:  Strength and tone appear normal for age. Extremities: No clubbing or cyanosis. No edema.  Distal pedal pulses are 2+ and equal bilaterally. Neuro: Alert and oriented X 3. No facial asymmetry. No focal deficit. Moves all extremities spontaneously. Psych:  Responds to questions appropriately with a normal affect.     Assessment and Plan  56 year old male with history of hypertension with an admission after presenting to  emergency room with chest pain.  Serum troponin was moderately elevated in the mid 400s and is increased to 600.  Pain has both typical atypical features of angina.  He had an admission for chest pain approximately 1 month ago.  Functional study was unremarkable.  Chest CT during this admission showed no acute aortic abnormality.  Chest x-ray revealed no acute cardiopulmonary disease.  He is currently on heparin.  He has had some pain relief with pain meds as well as nitrates.  We will plan to add Nitropaste 1 inch topically.  Continue with amlodipine 10 mg daily, atorvastatin 40 mg daily, lisinopril 10 mg daily, metoprolol succinate 50 mg daily  and continue with heparin.  At this point we will plan left heart cath on Monday to evaluate coronary anatomy.  We will make further changes in medications as need be.  1.  Non-ST elevation myocardial infarction-elevated troponin could be related to demand given his accelerated hypertension versus coronary disease however has had some exertional chest tightness as an outpatient.  Had a negative stress test 1 month ago.  We will continue as mentioned above with heparin topical nitrates metoprolol which we will increase to 50 mg daily and amlodipine.  We will proceed with cardiac catheterization on Monday unless he stabilizes significantly until then.  2.  Hypertension-continue with lisinopril amlodipine and metoprolol.  Will follow.  Signed, Dalia Heading MD 01/12/2021, 8:10 AM Pager: [phone]

## 2021-01-12 NOTE — Progress Notes (Signed)
ANTICOAGULATION CONSULT NOTE - Initial Consult  Pharmacy Consult for Heparin Indication: chest pain/ACS  No Known Allergies  Patient Measurements: Height: 6' (182.9 cm) Weight: 116.1 kg (256 lb) IBW/kg (Calculated) : 77.6 HEPARIN DW (KG): 102.7  Vital Signs: Temp: 98 F (36.7 C) (01/14 1740) Temp Source: Oral (01/14 1740) BP: 194/125 (01/14 2255) Pulse Rate: 100 (01/14 2255)  Labs: Recent Labs    01/11/21 1741 01/11/21 1837  HGB 16.4  --   HCT 46.1  --   PLT 275  --   CREATININE 0.97  --   TROPONINIHS 482* 438*    Estimated Creatinine Clearance: 117.2 mL/min (by C-G formula based on SCr of 0.97 mg/dL).   Medical History: Past Medical History:  Diagnosis Date  . Hypertension    Medications:  (Not in a hospital admission)   Assessment: No anticoagulants PTA.  Baseline labs ordered.  Heparin initiated for ACS.   Goal of Therapy:  Heparin level 0.3-0.7 units/ml Monitor platelets by anticoagulation protocol: Yes   Plan:  Heparin bolus 4000 units x 1 Heparin infusion at 1450 units/hr Check HL ~ 6 hours after heparin started  Valrie Hart A 01/12/2021,1:33 AM

## 2021-01-12 NOTE — ED Notes (Signed)
Cardiologist at bedside.  

## 2021-01-12 NOTE — Progress Notes (Signed)
ANTICOAGULATION CONSULT NOTE -   Pharmacy Consult for Heparin Indication: chest pain/ACS  No Known Allergies  Patient Measurements: Height: 6' (182.9 cm) Weight: 116.1 kg (256 lb) IBW/kg (Calculated) : 77.6 HEPARIN DW (KG): 102.7  Vital Signs: Temp: 97.9 F (36.6 C) (01/15 1042) Temp Source: Oral (01/15 1042) BP: 150/86 (01/15 1600) Pulse Rate: 111 (01/15 1600)  Labs: Recent Labs    01/11/21 1741 01/11/21 1837 01/12/21 0217 01/12/21 0627 01/12/21 0820 01/12/21 1548  HGB 16.4  --   --   --  17.4*  --   HCT 46.1  --   --   --  49.0  --   PLT 275  --   --   --  262  --   APTT  --   --  124*  --   --   --   LABPROT  --   --  13.7  --   --   --   INR  --   --  1.1  --   --   --   HEPARINUNFRC  --   --   --   --  0.16* 0.33  CREATININE 0.97  --   --  0.87  --   --   TROPONINIHS 482* 438* 689*  --   --   --     Estimated Creatinine Clearance: 130.7 mL/min (by C-G formula based on SCr of 0.87 mg/dL).   Medical History: Past Medical History:  Diagnosis Date  . Hypertension    Medications:  (Not in a hospital admission)   Assessment: No anticoagulants PTA.  Baseline labs ordered.  Heparin initiated for ACS.    01/15 0820 HL= 0.16 subtherapeutic, bolus/inc drip to 1750 units/hr  Goal of Therapy:  Heparin level 0.3-0.7 units/ml Monitor platelets by anticoagulation protocol: Yes   Plan:  Heparin bolus 4000 units x 1 Heparin infusion at 1450 units/hr Check HL ~ 6 hours after heparin started  - 01/15 1548  HL= 0.33 therapeutic.  Will continue drip at 1750 units/hr. F/u confimatory HL in 6 hrs and CBC daily  Jasn Xia A 01/12/2021,4:47 PM

## 2021-01-12 NOTE — ED Notes (Signed)
Wrote to attending re: continuing chest pain and high BP of 180/100.

## 2021-01-12 NOTE — Progress Notes (Signed)
ANTICOAGULATION CONSULT NOTE -   Pharmacy Consult for Heparin Indication: chest pain/ACS  No Known Allergies  Patient Measurements: Height: 6' (182.9 cm) Weight: 116.1 kg (256 lb) IBW/kg (Calculated) : 77.6 HEPARIN DW (KG): 102.7  Vital Signs: BP: 122/85 (01/15 2230) Pulse Rate: 111 (01/15 2230)  Labs: Recent Labs    01/11/21 1741 01/11/21 1837 01/12/21 0217 01/12/21 0627 01/12/21 0820 01/12/21 1548 01/12/21 2234  HGB 16.4  --   --   --  17.4*  --   --   HCT 46.1  --   --   --  49.0  --   --   PLT 275  --   --   --  262  --   --   APTT  --   --  124*  --   --   --   --   LABPROT  --   --  13.7  --   --   --   --   INR  --   --  1.1  --   --   --   --   HEPARINUNFRC  --   --   --   --  0.16* 0.33 0.26*  CREATININE 0.97  --   --  0.87  --   --   --   TROPONINIHS 482* 438* 689*  --   --   --   --     Estimated Creatinine Clearance: 130.7 mL/min (by C-G formula based on SCr of 0.87 mg/dL).   Medical History: Past Medical History:  Diagnosis Date  . Hypertension    Medications:  (Not in a hospital admission)   Assessment: No anticoagulants PTA.  Baseline labs ordered.  Heparin initiated for ACS.    01/15 0820 HL= 0.16 subtherapeutic, bolus/inc drip to 1750 units/hr  Goal of Therapy:  Heparin level 0.3-0.7 units/ml Monitor platelets by anticoagulation protocol: Yes   Plan:  Heparin bolus 4000 units x 1 Heparin infusion at 1450 units/hr Check HL ~ 6 hours after heparin started   - 01/15 1548  HL= 0.33 therapeutic.  Will continue drip at 1750 units/hr. F/u confimatory HL in 6 hrs and CBC daily   0115 2234 HL 0.26, SUBtherapeutic.  Will increase Heparin drip to 1900 units/hr and recheck HL in 6 hours.  Monitor CBC.  Valrie Hart A 01/12/2021,11:33 PM

## 2021-01-12 NOTE — ED Notes (Signed)
Pt asleep upon entrance to room; easily woken; significant other laying in stretcher with pt. Pt's resp reg/unlabored. Skin dry.

## 2021-01-13 LAB — CBC
HCT: 48.2 % (ref 39.0–52.0)
Hemoglobin: 16.6 g/dL (ref 13.0–17.0)
MCH: 30.7 pg (ref 26.0–34.0)
MCHC: 34.4 g/dL (ref 30.0–36.0)
MCV: 89.1 fL (ref 80.0–100.0)
Platelets: 268 10*3/uL (ref 150–400)
RBC: 5.41 MIL/uL (ref 4.22–5.81)
RDW: 12.6 % (ref 11.5–15.5)
WBC: 10.2 10*3/uL (ref 4.0–10.5)
nRBC: 0 % (ref 0.0–0.2)

## 2021-01-13 LAB — BASIC METABOLIC PANEL
Anion gap: 11 (ref 5–15)
BUN: 12 mg/dL (ref 6–20)
CO2: 29 mmol/L (ref 22–32)
Calcium: 9.5 mg/dL (ref 8.9–10.3)
Chloride: 98 mmol/L (ref 98–111)
Creatinine, Ser: 0.93 mg/dL (ref 0.61–1.24)
GFR, Estimated: >60 mL/min (ref >60)
Glucose, Bld: 171 mg/dL — ABNORMAL HIGH (ref 70–99)
Potassium: 3.9 mmol/L (ref 3.5–5.1)
Sodium: 138 mmol/L (ref 135–145)

## 2021-01-13 LAB — MAGNESIUM: Magnesium: 1.7 mg/dL (ref 1.7–2.4)

## 2021-01-13 LAB — PHOSPHORUS: Phosphorus: 3.8 mg/dL (ref 2.5–4.6)

## 2021-01-13 LAB — HEPARIN LEVEL (UNFRACTIONATED)
Heparin Unfractionated: 0.28 IU/mL — ABNORMAL LOW (ref 0.30–0.70)
Heparin Unfractionated: 0.35 IU/mL (ref 0.30–0.70)
Heparin Unfractionated: 0.37 IU/mL (ref 0.30–0.70)

## 2021-01-13 MED ORDER — CYCLOBENZAPRINE HCL 10 MG PO TABS
10.0000 mg | ORAL_TABLET | Freq: Two times a day (BID) | ORAL | Status: DC | PRN
  Administered 2021-01-13 – 2021-01-14 (×2): 10 mg via ORAL
  Filled 2021-01-13 (×2): qty 1

## 2021-01-13 NOTE — Progress Notes (Signed)
Patient Name: Willie Rose Date of Encounter: 01/13/2021  Hospital Problem List     Principal Problem:   NSTEMI (non-ST elevated myocardial infarction) [facility]) Active Problems:   Hypertensive urgency   Obesity with body mass index (BMI) of 30.0 to 39.9  55 y.o. male with history of hypertension who presented to emergency room complaining of chest pain.  It is constant radiating to both arms and his left shoulder.  Worse with exertion.  Is associated with shortness of breath.  States he had missed several doses of metoprolol in the past but has resumed it.  EKG showed sinus tachycardia with nonspecific ST-T wave changes.  Chest x-ray showed no acute cardiopulmonary disease.  Chest CT revealed no acute aortic abnormality.  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  She had a functional study in December 2021 showed an ejection fraction of 50% which was read as a low risk study.  Patient's initial troponin is 482 subsequently was 438 and 689.  Renal function and electrolytes were normal.  Patient was admitted with similar complaints approximately 1 month ago.  This prompted the functional study which was read as normal.  Was experience hypertensive and urgency at that time.  Patient also was noncompliant with his medications at that time.  Patient states he has been out of his metoprolol for approximately 26 hours and had resumed it.  He has continued to have intermittent episodes of midsternal chest pain.  On further discussion, patient has had several types of chest pain.  He does have some that occurs when ambulating but sometimes is able to ambulate 5 miles without too much difficulty.  Works in a fairly physical job and is usually able to carry this out without too much difficulty although occasionally will get chest discomfort with this.  He is currently pain-free.  He has had some improvement in his pain with pain meds as well as transiently with nitrates.  EKG shows no acute  injury.  Patient Profile     No further chest pain.  Subjective    Inpatient Medications    . aspirin EC  81 mg Oral Daily  . atorvastatin  40 mg Oral q1800  . lisinopril  10 mg Oral Daily  . metoprolol succinate  50 mg Oral Daily  . nitroGLYCERIN  1 inch Topical Q6H  . sodium chloride flush  3 mL Intravenous Q12H    Vital Signs    Vitals:   01/13/21 1400 01/13/21 1500 01/13/21 1600 01/13/21 1730  BP: (!) 108/50 111/63 108/70 127/81  Pulse: 83 79 96 96  Resp: 12 13  12   Temp:      TempSrc:      SpO2: 92% 95% 94% 95%  Weight:      Height:        Intake/Output Summary (Last 24 hours) at 01/13/2021 1818 Last data filed at 01/13/2021 1050 Gross per 24 hour  Intake 600 ml  Output --  Net 600 ml   Filed Weights   01/11/21 1740  Weight: 116.1 kg    Physical Exam    GEN: Well nourished, well developed, in no acute distress.  HEENT: normal.  Neck: Supple, no JVD, carotid bruits, or masses. Cardiac: RRR, no murmurs, rubs, or gallops. No clubbing, cyanosis, edema.  Radials/DP/PT 2+ and equal bilaterally.  Respiratory:  Respirations regular and unlabored, clear to auscultation bilaterally. GI: Soft, nontender, nondistended, BS + x 4. MS: no deformity or atrophy. Skin: warm and dry, no rash. Neuro:  Strength and sensation are intact. Psych: Normal affect.  Labs    CBC Recent Labs    01/12/21 0820 01/13/21 0334  WBC 9.4 10.2  HGB 17.4* 16.6  HCT 49.0 48.2  MCV 88.0 89.1  PLT 262 268   Basic Metabolic Panel Recent Labs    43/56/86 0627 01/13/21 0334  NA 136 138  K 4.1 3.9  CL 98 98  CO2 27 29  GLUCOSE 133* 171*  BUN 11 12  CREATININE 0.87 0.93  CALCIUM 9.4 9.5  MG  --  1.7  PHOS  --  3.8   Liver Function Tests No results for input(s): AST, ALT, ALKPHOS, BILITOT, PROT, ALBUMIN in the last 72 hours. No results for input(s): LIPASE, AMYLASE in the last 72 hours. Cardiac Enzymes No results for input(s): CKTOTAL, CKMB, CKMBINDEX, TROPONINI in the  last 72 hours. BNP No results for input(s): BNP in the last 72 hours. D-Dimer No results for input(s): DDIMER in the last 72 hours. Hemoglobin A1C No results for input(s): HGBA1C in the last 72 hours. Fasting Lipid Panel Recent Labs    01/12/21 0627  CHOL 212*  HDL 37*  LDLCALC 137*  TRIG 190*  CHOLHDL 5.7   Thyroid Function Tests No results for input(s): TSH, T4TOTAL, T3FREE, THYROIDAB in the last 72 hours.  Invalid input(s): FREET3  Telemetry    Sinus rhythm  ECG    Sinus rhythm no ischemia  Radiology    DG Chest 2 View  Result Date: 01/11/2021 CLINICAL DATA:  Chest pain, radiating across chest down into arm. EXAM: CHEST - 2 VIEW COMPARISON:  Chest x-ray 11/29/2020. FINDINGS: The heart size and mediastinal contours are within normal limits. Linear atelectasis versus scarring again noted within the left lower lung zone. Nipple shadows overlie bilateral lower lungs. No focal consolidation. No pulmonary edema. No pleural effusion. No pneumothorax. No acute osseous abnormality. IMPRESSION: No active cardiopulmonary disease. Electronically Signed   By: Tish Frederickson M.D.   On: 01/11/2021 18:26   CT Angio Chest/Abd/Pel for Dissection W and/or Wo Contrast  Result Date: 01/11/2021 CLINICAL DATA:  Chest pain and back pain EXAM: CT ANGIOGRAPHY CHEST, ABDOMEN AND PELVIS TECHNIQUE: Non-contrast CT of the chest was initially obtained. Multidetector CT imaging through the chest, abdomen and pelvis was performed using the standard protocol during bolus administration of intravenous contrast. Multiplanar reconstructed images and MIPs were obtained and reviewed to evaluate the vascular anatomy. CONTRAST:  OMNIPAQUE IOHEXOL 350 MG/ML SOLN COMPARISON:  None. FINDINGS: CTA CHEST FINDINGS Cardiovascular: --Heart: The heart size is normal.  There is nopericardial effusion. --Aorta: The course and caliber of the thoracic aorta are normal. There is no aortic atherosclerotic calcification.  Precontrast images show no aortic intramural hematoma. There is no blood pool, dissection or penetrating ulcer demonstrated on arterial phase postcontrast imaging. There is a conventional 3 vessel aortic arch branching pattern. The proximal arch vessels are widely patent. --Pulmonary Arteries: Contrast timing is optimized for preferential opacification of the aorta. Within that limitation, normal central pulmonary arteries. Mediastinum/Nodes: No mediastinal, hilar or axillary lymphadenopathy. The visualized thyroid and thoracic esophageal course are unremarkable. Lungs/Pleura: Streaky atelectasis seen at both lung bases. For for no pulmonary nodules or masses. No pleural effusion or pneumothorax. No focal airspace consolidation. No focal pleural abnormality. Musculoskeletal: No chest wall abnormality. No acute osseous findings. Review of the MIP images confirms the above findings. CTA ABDOMEN AND PELVIS FINDINGS VASCULAR Aorta: There is a tiny focal area of ectasia is seen within the  infrarenal abdominal aorta measuring 2.5 cm, series 5, image 48. No aneurysmal dilatation is seen. There is mild scattered aortic atherosclerosis. Celiac: No aneurysm, dissection or hemodynamically significant stenosis. Normal branching pattern SMA: Widely patent without dissection or stenosis. Renals: Single renal arteries bilaterally. No aneurysm, dissection, stenosis or evidence of fibromuscular dysplasia. IMA: Patent without abnormality. Inflow: No aneurysm, stenosis or dissection. Scattered atherosclerosis is noted. Veins: Normal course and caliber of the major veins. Assessment is otherwise limited by the arterial dominant contrast phase. Review of the MIP images confirms the above findings. NON-VASCULAR Hepatobiliary: Normal hepatic contours and density. No visible biliary dilatation. Normal gallbladder. Pancreas: Normal contours without ductal dilatation. No peripancreatic fluid collection. Spleen: Normal arterial phase splenic  enhancement pattern. Adrenals/Urinary Tract: --Adrenal glands: Normal. Kidneys: There is a small a 1 cm low-density lesion seen within the upper pole of the left kidney. There is mild perinephric stranding seen within the posterior upper pole of the left kidney which is nonspecific. No renal or collecting system calculi. --Urinary bladder: Unremarkable. Stomach/Bowel: --Stomach/Duodenum: No hiatal hernia or other gastric abnormality. Normal duodenal course and caliber. --Small bowel: No dilatation or inflammation. --Colon: No focal abnormality. --Appendix: Normal. Lymphatic:  No abdominal or pelvic lymphadenopathy. Reproductive: No free fluid in the pelvis. Musculoskeletal. No bony spinal canal stenosis or focal osseous abnormality. 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 Degenerative changes are seen at L5-S1 with severe left and moderate right neural foraminal narrowing. Other: None. Review of the MIP images confirms the above findings. IMPRESSION: 1. No acute aortic abnormality. 2. 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 No follow-up recommended. This recommendation follows ACR consensus guidelines: White Paper of the ACR Incidental Findings Committee II on Vascular Findings. J Am Coll Radiol 2013; 10:789-794. 3. Mild left-sided perinephric stranding which is nonspecific may be due to infectious or inflammatory process. 4.  Aortic Atherosclerosis (ICD10-I70.0). 5. Degenerative changes in the lower lumbar spine most notable at L5-S1. Electronically Signed   By: Jonna ClarkBindu  Avutu M.D.   On: 01/11/2021 22:01    Assessment & Plan    55 year old male with history of hypertension with an admission after presenting to emergency room with chest pain.  Serum troponin was moderately elevated in the mid 400s and is increased to 600.  Pain has both typical atypical features of angina.  He had an admission for chest pain approximately 1 month ago.  Functional study was  unremarkable.  Chest CT during this admission showed no acute aortic abnormality.  Chest x-ray revealed no acute cardiopulmonary disease.  He is currently on heparin.  He has had some pain relief with pain meds as well as nitrates.  We will plan to add Nitropaste 1 inch topically.  Continue with amlodipine 10 mg daily, atorvastatin 40 mg daily, lisinopril 10 mg daily, metoprolol succinate 50 mg daily and continue with heparin.  At this point we will plan left heart cath on Monday to evaluate coronary anatomy.  We will make further changes in medications as need be.  1.  Non-ST elevation myocardial infarction-elevated troponin could be related to demand given his accelerated hypertension versus coronary disease however has had some exertional chest tightness as an outpatient.  Had a negative stress test 1 month ago.  We will continue as mentioned above with heparin topical nitrates metoprolol which we will increase to 50 mg daily and amlodipine.  We will proceed with cardiac catheterization on Monday unless he stabilizes significantly until then.  2.  Hypertension-continue with lisinopril amlodipine and metoprolol.  Will follow.   Signed, Darlin Priestly Shimshon Narula MD 01/13/2021, 6:18 PM  Pager: [phone]

## 2021-01-13 NOTE — Progress Notes (Signed)
PROGRESS NOTE    Laurin CoderRonald L Tadesse  XLK:[id number] DOB: 03-Oct-1966 DOA: 01/11/2021 PCP: Patient, No Pcp Per   Brief Narrative:  This 55 years old male with PMH significant for hypertension presenting to the emergency department with chest pain that started few hours prior to the arrival.  He described chest pain as tightness radiating towards left and the right side of the back.  Patient was hospitalized a month ago with chest pain and uncontrolled hypertension. He had a negative stress test.  Patient is found to have elevated troponins which is trending up.  Patient is admitted for non-STEMI, cardiology was consulted. Patient was started on heparin.  CTA negative for aortic abnormality and acute PE.  Patient is scheduled to have left heart cath on Monday.  Assessment & Plan:   Principal Problem:   NSTEMI (non-ST elevated myocardial infarction) [facility](HCC) Active Problems:   Hypertensive urgency   Obesity with body mass index (BMI) of 30.0 to 39.9  NSTEMI (non-ST elevated myocardial infarction) [facility](HCC) Patient presented with chest pain, elevated troponin though with flat to downward trend of 482>438, and with recent negative stress test in December 2021 This could be demand ischemia from uncontrolled hypertension. Continue heparin GTT for now Continue aspirin, statin, beta-blocker.  Nitroglycerin sublingual as needed chest pain with morphine for breakthrough Blood pressure control Cardiology consulted recommended continue heparin and possible left heart cath on Monday.  Hypertensive urgency : Blood pressure 183/123 on arrival Patient got a dose of IV metoprolol in the emergency room but BP remains elevated Continue home lisinopril and metoprolol.  Amlodipine added Hydralazine IV as needed   Obesity with body mass index (BMI) of 30.0 to 39.9 Dietary and lifestyle counseling    DVT prophylaxis: Heparin gtt Code Status: Full code Family Communication: No family at bedside Disposition  Plan:   Status is: Inpatient  Remains inpatient appropriate because:Inpatient level of care appropriate due to severity of illness   Dispo: The patient is from: Home              Anticipated d/c is to: Home              Anticipated d/c date is: 2 days              Patient currently is not medically stable to d/c.   Consultants:   Cardiology  Procedures:  Antimicrobials:  Anti-infectives (From admission, onward)   None       Subjective: Patient was seen and examined at bedside.  Overnight events noted.  He reports chest pain has improved.   Denies any shortness of breath.  He is anticipating left heart cath tomorrow morning.  Objective: Vitals:   01/13/21 1000 01/13/21 1030 01/13/21 1100 01/13/21 1200  BP: 117/74 130/78 136/88 (!) 124/92  Pulse: 84 95 88 (!) 103  Resp:  18  (!) 26  Temp:      TempSrc:      SpO2: 95% 95% 91% 94%  Weight:      Height:        Intake/Output Summary (Last 24 hours) at 01/13/2021 1336 Last data filed at 01/13/2021 1050 Gross per 24 hour  Intake 600 ml  Output --  Net 600 ml   Filed Weights   01/11/21 1740  Weight: 116.1 kg    Examination:  General exam: Appears calm and comfortable, not in any acute distress. Respiratory system: Clear to auscultation. Respiratory effort normal. Cardiovascular system: S1 & S2 heard, RRR. No JVD, murmurs, rubs, gallops  or clicks. No pedal edema. Gastrointestinal system: Abdomen is nondistended, soft and nontender. No organomegaly or masses felt. Normal bowel sounds heard. Central nervous system: Alert and oriented. No focal neurological deficits. Extremities: Symmetric 5 x 5 power. Skin: No rashes, lesions or ulcers Psychiatry: Judgement and insight appear normal. Mood & affect appropriate.     Data Reviewed: I have personally reviewed following labs and imaging studies  CBC: Recent Labs  Lab 01/11/21 1741 01/12/21 0820 01/13/21 0334  WBC 7.2 9.4 10.2  HGB 16.4 17.4* 16.6  HCT 46.1  49.0 48.2  MCV 88.1 88.0 89.1  PLT 275 262 268   Basic Metabolic Panel: Recent Labs  Lab 01/11/21 1741 01/12/21 0627 01/13/21 0334  NA 138 136 138  K 4.1 4.1 3.9  CL 99 98 98  CO2 25 27 29   GLUCOSE 114* 133* 171*  BUN 12 11 12   CREATININE 0.97 0.87 0.93  CALCIUM 9.8 9.4 9.5  MG  --   --  1.7  PHOS  --   --  3.8   GFR: Estimated Creatinine Clearance: 122.2 mL/min (by C-G formula based on SCr of 0.93 mg/dL). Liver Function Tests: No results for input(s): AST, ALT, ALKPHOS, BILITOT, PROT, ALBUMIN in the last 168 hours. No results for input(s): LIPASE, AMYLASE in the last 168 hours. No results for input(s): AMMONIA in the last 168 hours. Coagulation Profile: Recent Labs  Lab 01/12/21 0217  INR 1.1   Cardiac Enzymes: No results for input(s): CKTOTAL, CKMB, CKMBINDEX, TROPONINI in the last 168 hours. BNP (last 3 results) No results for input(s): PROBNP in the last 8760 hours. HbA1C: No results for input(s): HGBA1C in the last 72 hours. CBG: No results for input(s): GLUCAP in the last 168 hours. Lipid Profile: Recent Labs    01/12/21 0627  CHOL 212*  HDL 37*  LDLCALC 137*  TRIG 190*  CHOLHDL 5.7   Thyroid Function Tests: No results for input(s): TSH, T4TOTAL, FREET4, T3FREE, THYROIDAB in the last 72 hours. Anemia Panel: No results for input(s): VITAMINB12, FOLATE, FERRITIN, TIBC, IRON, RETICCTPCT in the last 72 hours. Sepsis Labs: No results for input(s): PROCALCITON, LATICACIDVEN in the last 168 hours.  Recent Results (from the past 240 hour(s))  Resp Panel by RT-PCR (Flu A&B, Covid) Nasopharyngeal Swab     Status: None   Collection Time: 01/11/21  6:54 PM   Specimen: Nasopharyngeal Swab; Nasopharyngeal(NP) swabs in vial transport medium  Result Value Ref Range Status   SARS Coronavirus 2 by RT PCR NEGATIVE NEGATIVE Final    Comment: (NOTE) SARS-CoV-2 target nucleic acids are NOT DETECTED.  The SARS-CoV-2 RNA is generally detectable in upper  respiratory specimens during the acute phase of infection. The lowest concentration of SARS-CoV-2 viral copies this assay can detect is 138 copies/mL. A negative result does not preclude SARS-Cov-2 infection and should not be used as the sole basis for treatment or other patient management decisions. A negative result may occur with  improper specimen collection/handling, submission of specimen other than nasopharyngeal swab, presence of viral mutation(s) within the areas targeted by this assay, and inadequate number of viral copies(<138 copies/mL). A negative result must be combined with clinical observations, patient history, and epidemiological information. The expected result is Negative.  Fact Sheet for Patients:  01/14/21  Fact Sheet for Healthcare Providers:  01/13/21  This test is no t yet approved or cleared by the [employer] FDA and  has been authorized for detection and/or diagnosis of SARS-CoV-2 by FDA under an  Emergency Use Authorization (EUA). This EUA will remain  in effect (meaning this test can be used) for the duration of the COVID-19 declaration under Section 564(b)(1) of the Act, 21 U.S.C.section 360bbb-3(b)(1), unless the authorization is terminated  or revoked sooner.       Influenza A by PCR NEGATIVE NEGATIVE Final   Influenza B by PCR NEGATIVE NEGATIVE Final    Comment: (NOTE) The Xpert Xpress SARS-CoV-2/FLU/RSV plus assay is intended as an aid in the diagnosis of influenza from Nasopharyngeal swab specimens and should not be used as a sole basis for treatment. Nasal washings and aspirates are unacceptable for Xpert Xpress SARS-CoV-2/FLU/RSV testing.  Fact Sheet for Patients: [employer]  Fact Sheet for Healthcare Providers: [employer]it  This test is not yet approved or cleared by the [place] FDA and has been  authorized for detection and/or diagnosis of SARS-CoV-2 by FDA under an Emergency Use Authorization (EUA). This EUA will remain in effect (meaning this test can be used) for the duration of the COVID-19 declaration under Section 564(b)(1) of the Act, 21 U.S.C. section 360bbb-3(b)(1), unless the authorization is terminated or revoked.  Performed at [facility], [address] [place], [place] [address]     Radiology Studies: DG Chest 2 View  Result Date: 01/11/2021 CLINICAL DATA:  Chest pain, radiating across chest down into arm. EXAM: CHEST - 2 VIEW COMPARISON:  Chest x-ray 11/29/2020. FINDINGS: The heart size and mediastinal contours are within normal limits. Linear atelectasis versus scarring again noted within the left lower lung zone. Nipple shadows overlie bilateral lower lungs. No focal consolidation. No pulmonary edema. No pleural effusion. No pneumothorax. No acute osseous abnormality. IMPRESSION: No active cardiopulmonary disease. Electronically Signed   By: Tish Frederickson M.D.   On: 01/11/2021 18:26   CT Angio Chest/Abd/Pel for Dissection W and/or Wo Contrast  Result Date: 01/11/2021 CLINICAL DATA:  Chest pain and back pain EXAM: CT ANGIOGRAPHY CHEST, ABDOMEN AND PELVIS TECHNIQUE: Non-contrast CT of the chest was initially obtained. Multidetector CT imaging through the chest, abdomen and pelvis was performed using the standard protocol during bolus administration of intravenous contrast. Multiplanar reconstructed images and MIPs were obtained and reviewed to evaluate the vascular anatomy. CONTRAST:  OMNIPAQUE IOHEXOL 350 MG/ML SOLN COMPARISON:  None. FINDINGS: CTA CHEST FINDINGS Cardiovascular: --Heart: The heart size is normal.  There is nopericardial effusion. --Aorta: The course and caliber of the thoracic aorta are normal. There is no aortic atherosclerotic calcification. Precontrast images show no aortic intramural hematoma. There is no blood pool, dissection or  penetrating ulcer demonstrated on arterial phase postcontrast imaging. There is a conventional 3 vessel aortic arch branching pattern. The proximal arch vessels are widely patent. --Pulmonary Arteries: Contrast timing is optimized for preferential opacification of the aorta. Within that limitation, normal central pulmonary arteries. Mediastinum/Nodes: No mediastinal, hilar or axillary lymphadenopathy. The visualized thyroid and thoracic esophageal course are unremarkable. Lungs/Pleura: Streaky atelectasis seen at both lung bases. For for no pulmonary nodules or masses. No pleural effusion or pneumothorax. No focal airspace consolidation. No focal pleural abnormality. Musculoskeletal: No chest wall abnormality. No acute osseous findings. Review of the MIP images confirms the above findings. CTA ABDOMEN AND PELVIS FINDINGS VASCULAR Aorta: There is a tiny focal area of ectasia is seen within the infrarenal abdominal aorta measuring 2.5 cm, series 5, image 48. No aneurysmal dilatation is seen. There is mild scattered aortic atherosclerosis. Celiac: No aneurysm, dissection or hemodynamically significant stenosis. Normal branching pattern SMA: Widely patent without dissection or  stenosis. Renals: Single renal arteries bilaterally. No aneurysm, dissection, stenosis or evidence of fibromuscular dysplasia. IMA: Patent without abnormality. Inflow: No aneurysm, stenosis or dissection. Scattered atherosclerosis is noted. Veins: Normal course and caliber of the major veins. Assessment is otherwise limited by the arterial dominant contrast phase. Review of the MIP images confirms the above findings. NON-VASCULAR Hepatobiliary: Normal hepatic contours and density. No visible biliary dilatation. Normal gallbladder. Pancreas: Normal contours without ductal dilatation. No peripancreatic fluid collection. Spleen: Normal arterial phase splenic enhancement pattern. Adrenals/Urinary Tract: --Adrenal glands: Normal. Kidneys: There is a  small a 1 cm low-density lesion seen within the upper pole of the left kidney. There is mild perinephric stranding seen within the posterior upper pole of the left kidney which is nonspecific. No renal or collecting system calculi. --Urinary bladder: Unremarkable. Stomach/Bowel: --Stomach/Duodenum: No hiatal hernia or other gastric abnormality. Normal duodenal course and caliber. --Small bowel: No dilatation or inflammation. --Colon: No focal abnormality. --Appendix: Normal. Lymphatic:  No abdominal or pelvic lymphadenopathy. Reproductive: No free fluid in the pelvis. Musculoskeletal. No bony spinal canal stenosis or focal osseous abnormality. 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 Degenerative changes are seen at L5-S1 with severe left and moderate right neural foraminal narrowing. Other: None. Review of the MIP images confirms the above findings. IMPRESSION: 1. No acute aortic abnormality. 2. 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 No follow-up recommended. This recommendation follows ACR consensus guidelines: White Paper of the ACR Incidental Findings Committee II on Vascular Findings. J Am Coll Radiol 2013; 10:789-794. 3. Mild left-sided perinephric stranding which is nonspecific may be due to infectious or inflammatory process. 4.  Aortic Atherosclerosis (ICD10-I70.0). 5. Degenerative changes in the lower lumbar spine most notable at L5-S1. Electronically Signed   By: Jonna Clark M.D.   On: 01/11/2021 22:01   Scheduled Meds: . amLODipine  10 mg Oral Daily  . aspirin EC  81 mg Oral Daily  . atorvastatin  40 mg Oral q1800  . lisinopril  10 mg Oral Daily  . metoprolol succinate  50 mg Oral Daily  . nitroGLYCERIN  1 inch Topical Q6H  . sodium chloride flush  3 mL Intravenous Q12H   Continuous Infusions: . heparin 2,000 Units/hr (01/13/21 0654)     LOS: 1 day    Time spent: 25 mins.    Cipriano Bunker, MD [facility]   If 7PM-7AM,  please contact night-coverage

## 2021-01-13 NOTE — Progress Notes (Signed)
ANTICOAGULATION CONSULT NOTE -   Pharmacy Consult for Heparin Indication: chest pain/ACS  No Known Allergies  Patient Measurements: Height: 6' (182.9 cm) Weight: 116.1 kg (256 lb) IBW/kg (Calculated) : 77.6 HEPARIN DW (KG): 102.7  Vital Signs: BP: 124/92 (01/16 1200) Pulse Rate: 103 (01/16 1200)  Labs: Recent Labs    01/11/21 1741 01/11/21 1837 01/12/21 0217 01/12/21 0627 01/12/21 0820 01/12/21 1548 01/12/21 2234 01/13/21 0334 01/13/21 1314  HGB 16.4  --   --   --  17.4*  --   --  16.6  --   HCT 46.1  --   --   --  49.0  --   --  48.2  --   PLT 275  --   --   --  262  --   --  268  --   APTT  --   --  124*  --   --   --   --   --   --   LABPROT  --   --  13.7  --   --   --   --   --   --   INR  --   --  1.1  --   --   --   --   --   --   HEPARINUNFRC  --   --   --   --  0.16*   < > 0.26* 0.28* 0.35  CREATININE 0.97  --   --  0.87  --   --   --  0.93  --   TROPONINIHS 482* 438* 689*  --   --   --   --   --   --    < > = values in this interval not displayed.    Estimated Creatinine Clearance: 122.2 mL/min (by C-G formula based on SCr of 0.93 mg/dL).   Medical History: Past Medical History:  Diagnosis Date  . Hypertension    Medications:  (Not in a hospital admission)   Assessment: No anticoagulants PTA.  Baseline labs ordered.  Heparin initiated for ACS.    01/15 0820 HL= 0.16 subtherapeutic, bolus/inc drip to 1750 units/hr  - 01/15 1548  HL= 0.33 therapeutic.  Will continue drip at 1750 units/hr. F/u confimatory HL in 6 hrs and CBC daily  0115 2234 HL 0.26, SUBtherapeutic.  Will increase Heparin drip to 1900 units/hr and recheck HL in 6 hours.  Monitor CBC.  0116 0334 HL 0.28, SUBtherapeutic (drawn slightly early).  Will increase Heparin drip to 2000 units/hr and recheck HL in 6 hours.  CBC stable.   Goal of Therapy:  Heparin level 0.3-0.7 units/ml Monitor platelets by anticoagulation protocol: Yes   Plan:   0116 1314 HL 0.35, therapeutic.    Will continue Heparin drip at 2000 units/hr and check confirmatory HL in 6 hours.  CBC stable.   F/u CBC w/ am labs    Lucina Betty A 01/13/2021,1:47 PM

## 2021-01-13 NOTE — Progress Notes (Signed)
ANTICOAGULATION CONSULT NOTE -   Pharmacy Consult for Heparin Indication: chest pain/ACS  No Known Allergies  Patient Measurements: Height: 6' (182.9 cm) Weight: 116.1 kg (256 lb) IBW/kg (Calculated) : 77.6 HEPARIN DW (KG): 102.7  Vital Signs: BP: 128/69 (01/16 1930) Pulse Rate: 91 (01/16 1930)  Labs: Recent Labs    01/11/21 1741 01/11/21 1837 01/12/21 0217 01/12/21 0627 01/12/21 0820 01/12/21 1548 01/13/21 0334 01/13/21 1314 01/13/21 1948  HGB 16.4  --   --   --  17.4*  --  16.6  --   --   HCT 46.1  --   --   --  49.0  --  48.2  --   --   PLT 275  --   --   --  262  --  268  --   --   APTT  --   --  124*  --   --   --   --   --   --   LABPROT  --   --  13.7  --   --   --   --   --   --   INR  --   --  1.1  --   --   --   --   --   --   HEPARINUNFRC  --   --   --   --  0.16*   < > 0.28* 0.35 0.37  CREATININE 0.97  --   --  0.87  --   --  0.93  --   --   TROPONINIHS 482* 438* 689*  --   --   --   --   --   --    < > = values in this interval not displayed.    Estimated Creatinine Clearance: 122.2 mL/min (by C-G formula based on SCr of 0.93 mg/dL).   Medical History: Past Medical History:  Diagnosis Date  . Hypertension    Medications:  (Not in a hospital admission)   Assessment: No anticoagulants PTA.  Baseline labs ordered.  Heparin initiated for ACS.    01/15 0820 HL= 0.16 subtherapeutic, bolus/inc drip to 1750 units/hr  - 01/15 1548  HL= 0.33 therapeutic.  Will continue drip at 1750 units/hr. F/u confimatory HL in 6 hrs and CBC daily  0115 2234 HL 0.26, SUBtherapeutic.  Will increase Heparin drip to 1900 units/hr and recheck HL in 6 hours.  Monitor CBC.  0116 0334 HL 0.28, SUBtherapeutic (drawn slightly early).  Will increase Heparin drip to 2000 units/hr and recheck HL in 6 hours.  CBC stable.   1/16:  HL @ 1948 = 0.37  Goal of Therapy:  Heparin level 0.3-0.7 units/ml Monitor platelets by anticoagulation protocol: Yes   Plan:  0116 1314 HL  0.35, therapeutic 0116 1948 HL 0.37, therapeutic X 2   Will continue pt on current rate and recheck HL on 1/17 with AM labs.    Shaneka Efaw D 01/13/2021,9:16 PM

## 2021-01-13 NOTE — Progress Notes (Signed)
ANTICOAGULATION CONSULT NOTE -   Pharmacy Consult for Heparin Indication: chest pain/ACS  No Known Allergies  Patient Measurements: Height: 6' (182.9 cm) Weight: 116.1 kg (256 lb) IBW/kg (Calculated) : 77.6 HEPARIN DW (KG): 102.7  Vital Signs: BP: 145/80 (01/16 0335) Pulse Rate: 112 (01/16 0335)  Labs: Recent Labs    01/11/21 1741 01/11/21 1741 01/11/21 1837 01/12/21 0217 01/12/21 0627 01/12/21 0820 01/12/21 1548 01/12/21 2234 01/13/21 0334  HGB 16.4  --   --   --   --  17.4*  --   --  16.6  HCT 46.1  --   --   --   --  49.0  --   --  48.2  PLT 275  --   --   --   --  262  --   --  268  APTT  --   --   --  124*  --   --   --   --   --   LABPROT  --   --   --  13.7  --   --   --   --   --   INR  --   --   --  1.1  --   --   --   --   --   HEPARINUNFRC  --    < >  --   --   --  0.16* 0.33 0.26* 0.28*  CREATININE 0.97  --   --   --  0.87  --   --   --  0.93  TROPONINIHS 482*  --  438* 689*  --   --   --   --   --    < > = values in this interval not displayed.    Estimated Creatinine Clearance: 122.2 mL/min (by C-G formula based on SCr of 0.93 mg/dL).   Medical History: Past Medical History:  Diagnosis Date  . Hypertension    Medications:  (Not in a hospital admission)   Assessment: No anticoagulants PTA.  Baseline labs ordered.  Heparin initiated for ACS.    01/15 0820 HL= 0.16 subtherapeutic, bolus/inc drip to 1750 units/hr  Goal of Therapy:  Heparin level 0.3-0.7 units/ml Monitor platelets by anticoagulation protocol: Yes   Plan:  Heparin bolus 4000 units x 1 Heparin infusion at 1450 units/hr Check HL ~ 6 hours after heparin started   - 01/15 1548  HL= 0.33 therapeutic.  Will continue drip at 1750 units/hr. F/u confimatory HL in 6 hrs and CBC daily   0115 2234 HL 0.26, SUBtherapeutic.  Will increase Heparin drip to 1900 units/hr and recheck HL in 6 hours.  Monitor CBC.   0116 0334 HL 0.28, SUBtherapeutic (drawn slightly early).  Will increase  Heparin drip to 2000 units/hr and recheck HL in 6 hours.  CBC stable.     Margo Aye, Leeah Politano A 01/13/2021,5:52 AM

## 2021-01-14 ENCOUNTER — Encounter
Admission: EM | Disposition: A | Discharge status: Home / Self Care | Payer: Self-pay | Source: Home / Self Care | Attending: Family Medicine

## 2021-01-14 ENCOUNTER — Encounter: Payer: Self-pay | Admitting: Cardiology

## 2021-01-14 HISTORY — PX: LEFT HEART CATH AND CORONARY ANGIOGRAPHY: CATH118249

## 2021-01-14 LAB — CBC
HCT: 44.3 % (ref 39.0–52.0)
Hemoglobin: 15.5 g/dL (ref 13.0–17.0)
MCH: 31.1 pg (ref 26.0–34.0)
MCHC: 35 g/dL (ref 30.0–36.0)
MCV: 88.8 fL (ref 80.0–100.0)
Platelets: 247 10*3/uL (ref 150–400)
RBC: 4.99 MIL/uL (ref 4.22–5.81)
RDW: 12.5 % (ref 11.5–15.5)
WBC: 7.5 10*3/uL (ref 4.0–10.5)
nRBC: 0 % (ref 0.0–0.2)

## 2021-01-14 LAB — HEPARIN LEVEL (UNFRACTIONATED): Heparin Unfractionated: 0.38 IU/mL (ref 0.30–0.70)

## 2021-01-14 SURGERY — LEFT HEART CATH AND CORONARY ANGIOGRAPHY
Anesthesia: Moderate Sedation

## 2021-01-14 MED ORDER — METOPROLOL SUCCINATE ER 50 MG PO TB24: 50.0000 mg | ORAL_TABLET | Freq: Every day | ORAL | 1 refills | Status: AC

## 2021-01-14 MED ORDER — ACETAMINOPHEN 325 MG PO TABS: 650.0000 mg | ORAL_TABLET | ORAL | Status: DC | PRN

## 2021-01-14 MED ORDER — ONDANSETRON HCL 4 MG/2ML IJ SOLN: 4.0000 mg | Freq: Four times a day (QID) | INTRAMUSCULAR | Status: DC | PRN

## 2021-01-14 MED ORDER — SODIUM CHLORIDE 0.9 % IV SOLN: 250.0000 mL | INTRAVENOUS | Status: DC | PRN

## 2021-01-14 MED ORDER — HYDRALAZINE HCL 20 MG/ML IJ SOLN: 10.0000 mg | INTRAMUSCULAR | Status: DC | PRN

## 2021-01-14 MED ORDER — FENTANYL CITRATE (PF) 100 MCG/2ML IJ SOLN
INTRAMUSCULAR | Status: AC
  Filled 2021-01-14: qty 2

## 2021-01-14 MED ORDER — ASPIRIN 81 MG PO CHEW: 81.0000 mg | CHEWABLE_TABLET | ORAL | Status: AC

## 2021-01-14 MED ORDER — ASPIRIN EC 81 MG PO TBEC
DELAYED_RELEASE_TABLET | ORAL | Status: AC
  Filled 2021-01-14: qty 1

## 2021-01-14 MED ORDER — ASPIRIN 81 MG PO CHEW
CHEWABLE_TABLET | ORAL | Status: AC
  Administered 2021-01-14: 81 mg via ORAL
  Filled 2021-01-14: qty 1

## 2021-01-14 MED ORDER — ASPIRIN 81 MG PO TBEC: 81.0000 mg | DELAYED_RELEASE_TABLET | Freq: Every day | ORAL | 11 refills | Status: AC

## 2021-01-14 MED ORDER — MIDAZOLAM HCL 2 MG/2ML IJ SOLN
INTRAMUSCULAR | Status: AC
  Filled 2021-01-14: qty 2

## 2021-01-14 MED ORDER — ISOSORBIDE MONONITRATE ER 60 MG PO TB24
60.0000 mg | ORAL_TABLET | Freq: Every day | ORAL | Status: DC
  Filled 2021-01-14 (×2): qty 1

## 2021-01-14 MED ORDER — SODIUM CHLORIDE 0.9 % WEIGHT BASED INFUSION
3.0000 mL/kg/h | INTRAVENOUS | Status: DC
  Administered 2021-01-14: 3 mL/kg/h via INTRAVENOUS

## 2021-01-14 MED ORDER — SODIUM CHLORIDE 0.9% FLUSH: 3.0000 mL | INTRAVENOUS | Status: DC | PRN

## 2021-01-14 MED ORDER — HYDROMORPHONE HCL 1 MG/ML IJ SOLN
INTRAMUSCULAR | Status: AC
  Filled 2021-01-14: qty 1

## 2021-01-14 MED ORDER — SODIUM CHLORIDE 0.9 % WEIGHT BASED INFUSION
1.0000 mL/kg/h | INTRAVENOUS | Status: DC
  Administered 2021-01-14: 1 mL/kg/h via INTRAVENOUS

## 2021-01-14 MED ORDER — MIDAZOLAM HCL 2 MG/2ML IJ SOLN
INTRAMUSCULAR | Status: DC | PRN
  Administered 2021-01-14: 0.5 mg via INTRAVENOUS
  Administered 2021-01-14: 1 mg via INTRAVENOUS

## 2021-01-14 MED ORDER — FENTANYL CITRATE (PF) 100 MCG/2ML IJ SOLN
INTRAMUSCULAR | Status: DC | PRN
  Administered 2021-01-14: 50 ug via INTRAVENOUS
  Administered 2021-01-14: 25 ug via INTRAVENOUS

## 2021-01-14 MED ORDER — ISOSORBIDE MONONITRATE ER 60 MG PO TB24: 60.0000 mg | ORAL_TABLET | Freq: Every day | ORAL | 1 refills | Status: AC

## 2021-01-14 MED ORDER — LABETALOL HCL 5 MG/ML IV SOLN: 10.0000 mg | INTRAVENOUS | Status: DC | PRN

## 2021-01-14 MED ORDER — SODIUM CHLORIDE 0.9% FLUSH: 3.0000 mL | Freq: Two times a day (BID) | INTRAVENOUS | Status: DC

## 2021-01-14 MED ORDER — NITROGLYCERIN 0.4 MG SL SUBL
0.4000 mg | SUBLINGUAL_TABLET | SUBLINGUAL | 1 refills | Status: AC | PRN
Start: 2021-01-14 — End: ?

## 2021-01-14 MED ORDER — ATORVASTATIN CALCIUM 40 MG PO TABS: 40.0000 mg | ORAL_TABLET | Freq: Every day | ORAL | 1 refills | Status: AC

## 2021-01-14 MED ORDER — IOHEXOL 300 MG/ML  SOLN
INTRAMUSCULAR | Status: DC | PRN
  Administered 2021-01-14: 100 mL

## 2021-01-14 MED ORDER — HEPARIN (PORCINE) IN NACL 2000-0.9 UNIT/L-% IV SOLN
INTRAVENOUS | Status: DC | PRN
  Administered 2021-01-14: 500 mL

## 2021-01-14 MED ORDER — SODIUM CHLORIDE 0.9 % WEIGHT BASED INFUSION: 1.0000 mL/kg/h | INTRAVENOUS | Status: DC

## 2021-01-14 MED ORDER — HEPARIN (PORCINE) IN NACL 1000-0.9 UT/500ML-% IV SOLN
INTRAVENOUS | Status: AC
  Filled 2021-01-14: qty 1000

## 2021-01-14 SURGICAL SUPPLY — 10 items
CATH INFINITI 5FR ANG PIGTAIL (CATHETERS) ×2 IMPLANT
CATH INFINITI 5FR JL4 (CATHETERS) ×2 IMPLANT
CATH INFINITI 5FR JL5 (CATHETERS) ×2 IMPLANT
CATH INFINITI JR4 5F (CATHETERS) ×2 IMPLANT
DEVICE CLOSURE MYNXGRIP 5F (Vascular Products) ×2 IMPLANT
KIT MANI 3VAL PERCEP (MISCELLANEOUS) ×2 IMPLANT
NEEDLE PERC 18GX7CM (NEEDLE) ×2 IMPLANT
PACK CARDIAC CATH (CUSTOM PROCEDURE TRAY) ×2 IMPLANT
SHEATH AVANTI 5FR X 11CM (SHEATH) ×2 IMPLANT
WIRE GUIDERIGHT .035X150 (WIRE) ×2 IMPLANT

## 2021-01-14 NOTE — Discharge Summary (Signed)
Physician Discharge Summary  Willie Rose UJW:[id number] DOB: Jul 29, 1966 DOA: 01/11/2021  PCP: Patient, No Pcp Per  Admit date: 01/11/2021   Discharge date: 01/14/2021  Admitted From: Home.  Disposition:  Home.  Recommendations for Outpatient Follow-up:  1. Follow up with PCP in 1-2 weeks. 2. Please obtain BMP/CBC in one week. 3. Advised to follow-up with cardiology Dr. Juliann Pares in 1 week.  4. Advised to take aspirin 81 mg daily, Lipitor 40 mg daily, lisinopril 10 mg daily, metoprolol 50 mg daily, Imdur 60 mg daily  Home Health: None. Equipment / Devices: None Discharge Condition: Stable.  CODE STATUS:Full code Diet recommendation: Heart Healthy   Brief [facility] Course: This 55 years old male with PMH significant for hypertension presenting to the emergency department with chest pain that started few hours prior to the arrival.  He described chest pain as tightness radiating towards left and the right side of the back.  Patient was hospitalized a month ago with chest pain and uncontrolled hypertension. He had a negative stress test.  Patient is found to have elevated troponins which is trending up. There were no significant EKG changes. Patient is admitted for non-STEMI, Cardiology was consulted. Patient was started on heparin gtt. CTA negative for aortic abnormality and acute PE.    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  Cardiology recommended medical management.  Cardiology cleared the patient to be discharged.  LHC : 01/14/2021:   Prox RCA lesion is 100% stenosed.  The left ventricular ejection fraction is 50-55% by visual estimate.  There is no aortic valve stenosis.  There is no mitral valve stenosis and no mitral valve prolapse evident.   LM-large vessel, normal LAD-large vessel, normal supplying collaterals to CTO RCA LCx-large and normal RCA-CTO ostially   LV appears  normal   Discharge medications: Aspirin 81 mg daily,  Lipitor 40 mg daily,  lisinopril 10 mg daily,  metoprolol 50 mg daily,  Imdur 60 mg daily,  nitroglycerin SL as needed.  He was managed for below problems.  Discharge Diagnoses:  Principal Problem:   NSTEMI (non-ST elevated myocardial infarction) [facility]) Active Problems:   Hypertensive urgency   Obesity with body mass index (BMI) of 30.0 to 39.9  NSTEMI (non-ST elevated myocardial infarction) [facility]) Patient presented with chest pain, elevated troponin though with flat to downward trend of 482>438,and with recent negative stress test in December 2021 This could be demand ischemia from uncontrolled hypertension. Continue heparin GTT for now Continue aspirin, statin, beta-blocker. Nitroglycerin sublingual as needed chest pain with morphine for breakthrough Blood pressure control Cardiology consulted recommended continue heparin and possible left heart cath on Monday. Left heart cath shows chronic 100% RCA occlusion but has good collaterals. Cardiology recommended maximal medical management.  Patient can be discharged   Hypertensive urgency : Improved. Blood pressure 183/123 on arrival Patient got a dose of IV metoprolol in the emergency room but BP remains elevated Continue home lisinopril and metoprolol. Amlodipine added Hydralazine IV as needed   Obesity with body mass index (BMI) of 30.0 to 39.9 Dietary and lifestyle counseling   Discharge Instructions  Discharge Instructions    AMB Referral to Cardiac Rehabilitation - Phase II   Complete by: As directed    Diagnosis: NSTEMI   After initial evaluation and assessments completed: Virtual Based Care may be provided alone or in conjunction with Phase 2 Cardiac Rehab based on patient barriers.: Yes   Call MD for:  difficulty breathing, headache or visual disturbances   Complete by: As directed    Call MD for:  persistant dizziness or light-headedness   Complete  by: As directed    Call MD for:  persistant nausea and vomiting   Complete by: As directed    Diet - low sodium heart healthy   Complete by: As directed    Diet Carb Modified   Complete by: As directed    Discharge instructions   Complete by: As directed    Advised to follow-up with primary care physician in 1 week. Advised to follow-up with cardiology Dr. Call wood in 1 week.  Advised to take aspirin 81 mg daily, Lipitor 40 mg daily, lisinopril 10 mg daily, metoprolol 50 mg daily, Imdur 60 mg daily   Increase activity slowly   Complete by: As directed      Allergies as of 01/14/2021   No Known Allergies     Medication List    STOP taking these medications   hydrochlorothiazide 25 MG tablet Commonly known as: HYDRODIURIL     TAKE these medications   albuterol 108 (90 Base) MCG/ACT inhaler Commonly known as: VENTOLIN HFA Inhale 1 puff into the lungs every 4 (four) hours as needed.   aspirin 81 MG EC tablet Take 1 tablet (81 mg total) by mouth daily. Swallow whole.   atorvastatin 40 MG tablet Commonly known as: LIPITOR Take 1 tablet (40 mg total) by mouth daily at 6 PM.   cyclobenzaprine 10 MG tablet Commonly known as: FLEXERIL Take 10 mg by mouth 3 (three) times daily as needed.   isosorbide mononitrate 60 MG 24 hr tablet Commonly known as: IMDUR Take 1 tablet (60 mg total) by mouth daily.   lisinopril 10 MG tablet Commonly known as: ZESTRIL Take 1 tablet (10 mg total) by mouth daily.   metoprolol succinate 50 MG 24 hr tablet Commonly known as: TOPROL-XL Take 1 tablet (50 mg total) by mouth daily. Take with or immediately following a meal. What changed:   medication strength  how much to take  additional instructions   nitroGLYCERIN 0.4 MG SL tablet Commonly known as: NITROSTAT Place 1 tablet (0.4 mg total) under the tongue every 5 (five) minutes x 3 doses as needed for chest pain.       Follow-up Information    Callwood, Dwayne D, MD Follow up in 1  week(s).   Specialties: Cardiology, Internal Medicine Contact information: [address] [place] [address] [phone]              No Known Allergies  Consultations:  Cardiology   Procedures/Studies: DG Chest 2 View  Result Date: 01/11/2021 CLINICAL DATA:  Chest pain, radiating across chest down into arm. EXAM: CHEST - 2 VIEW COMPARISON:  Chest x-ray 11/29/2020. FINDINGS: The heart size and mediastinal contours are within normal limits. Linear atelectasis versus scarring again noted within the left lower lung zone. Nipple shadows overlie bilateral lower lungs. No focal consolidation. No pulmonary edema. No pleural effusion. No pneumothorax. No acute osseous abnormality. IMPRESSION: No active cardiopulmonary disease. Electronically Signed   By: Tish FredericksonMorgane  Naveau M.D.   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  The left ventricular ejection fraction is 50-55% by visual estimate.  There is no aortic valve stenosis.  There is no mitral valve stenosis and no mitral valve prolapse evident.  LM-large vessel, normal LAD-large vessel, normal supplying collaterals to CTO RCA  LCx-large and normal RCA-CTO ostially LV appears normal Medical management   CT Angio Chest/Abd/Pel for Dissection W and/or Wo Contrast  Result Date: 01/11/2021 CLINICAL DATA:  Chest pain and back pain EXAM: CT ANGIOGRAPHY CHEST, ABDOMEN AND PELVIS TECHNIQUE: Non-contrast CT of the chest was initially obtained. Multidetector CT imaging through the chest, abdomen and pelvis was performed using the standard protocol during bolus administration of intravenous contrast. Multiplanar reconstructed images and MIPs were obtained and reviewed to evaluate the vascular anatomy. CONTRAST:  OMNIPAQUE IOHEXOL 350 MG/ML SOLN COMPARISON:  None. FINDINGS: CTA CHEST FINDINGS Cardiovascular: --Heart: The heart size is normal.  There is nopericardial  effusion. --Aorta: The course and caliber of the thoracic aorta are normal. There is no aortic atherosclerotic calcification. Precontrast images show no aortic intramural hematoma. There is no blood pool, dissection or penetrating ulcer demonstrated on arterial phase postcontrast imaging. There is a conventional 3 vessel aortic arch branching pattern. The proximal arch vessels are widely patent. --Pulmonary Arteries: Contrast timing is optimized for preferential opacification of the aorta. Within that limitation, normal central pulmonary arteries. Mediastinum/Nodes: No mediastinal, hilar or axillary lymphadenopathy. The visualized thyroid and thoracic esophageal course are unremarkable. Lungs/Pleura: Streaky atelectasis seen at both lung bases. For for no pulmonary nodules or masses. No pleural effusion or pneumothorax. No focal airspace consolidation. No focal pleural abnormality. Musculoskeletal: No chest wall abnormality. No acute osseous findings. Review of the MIP images confirms the above findings. CTA ABDOMEN AND PELVIS FINDINGS VASCULAR Aorta: There is a tiny focal area of ectasia is seen within the infrarenal abdominal aorta measuring 2.5 cm, series 5, image 48. No aneurysmal dilatation is seen. There is mild scattered aortic atherosclerosis. Celiac: No aneurysm, dissection or hemodynamically significant stenosis. Normal branching pattern SMA: Widely patent without dissection or stenosis. Renals: Single renal arteries bilaterally. No aneurysm, dissection, stenosis or evidence of fibromuscular dysplasia. IMA: Patent without abnormality. Inflow: No aneurysm, stenosis or dissection. Scattered atherosclerosis is noted. Veins: Normal course and caliber of the major veins. Assessment is otherwise limited by the arterial dominant contrast phase. Review of the MIP images confirms the above findings. NON-VASCULAR Hepatobiliary: Normal hepatic contours and density. No visible biliary dilatation. Normal gallbladder.  Pancreas: Normal contours without ductal dilatation. No peripancreatic fluid collection. Spleen: Normal arterial phase splenic enhancement pattern. Adrenals/Urinary Tract: --Adrenal glands: Normal. Kidneys: There is a small a 1 cm low-density lesion seen within the upper pole of the left kidney. There is mild perinephric stranding seen within the posterior upper pole of the left kidney which is nonspecific. No renal or collecting system calculi. --Urinary bladder: Unremarkable. Stomach/Bowel: --Stomach/Duodenum: No hiatal hernia or other gastric abnormality. Normal duodenal course and caliber. --Small bowel: No dilatation or inflammation. --Colon: No focal abnormality. --Appendix: Normal. Lymphatic:  No abdominal or pelvic lymphadenopathy. Reproductive: No free fluid in the pelvis. Musculoskeletal. No bony spinal canal stenosis or focal osseous abnormality. 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 Degenerative changes are seen at L5-S1 with severe left and moderate right neural foraminal narrowing. Other: None. Review of the MIP images confirms the above findings. IMPRESSION: 1. No acute aortic abnormality. 2. 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 No follow-up recommended. This recommendation follows ACR consensus guidelines: White Paper of the ACR Incidental Findings Committee II on Vascular Findings. J Am Coll Radiol 2013; 10:789-794. 3. Mild left-sided perinephric stranding which is nonspecific may be due to infectious or inflammatory process. 4.  Aortic Atherosclerosis (ICD10-I70.0). 5. Degenerative changes in  the lower lumbar spine most notable at L5-S1. Electronically Signed   By: Jonna ClarkBindu  Avutu M.D.   On: 01/11/2021 22:01    Echocardiogram, left heart cath   Subjective: Patient was seen and examined at bedside.  Overnight events noted.  Patient reports feeling much better.  Patient underwent left heart cath, tolerated well. Patient cleared from  cardiology to be discharged.  Discharge Exam: Vitals:   01/14/21 1215 01/14/21 1230  BP: 113/74 105/70  Pulse: 81 90  Resp: (!) 7 12  Temp:    SpO2: 94% 90%   Vitals:   01/14/21 1145 01/14/21 1200 01/14/21 1215 01/14/21 1230  BP: 117/81 114/73 113/74 105/70  Pulse: 78 78 81 90  Resp: 10 11 (!) 7 12  Temp:      TempSrc:      SpO2: 94% 94% 94% 90%  Weight:      Height:        General: Pt is alert, awake, not in acute distress Cardiovascular: RRR, S1/S2 +, no rubs, no gallops Respiratory: CTA bilaterally, no wheezing, no rhonchi Abdominal: Soft, NT, ND, bowel sounds + Extremities: no edema, no cyanosis    The results of significant diagnostics from this hospitalization (including imaging, microbiology, ancillary and laboratory) are listed below for reference.     Microbiology: Recent Results (from the past 240 hour(s))  Resp Panel by RT-PCR (Flu A&B, Covid) Nasopharyngeal Swab     Status: None   Collection Time: 01/11/21  6:54 PM   Specimen: Nasopharyngeal Swab; Nasopharyngeal(NP) swabs in vial transport medium  Result Value Ref Range Status   SARS Coronavirus 2 by RT PCR NEGATIVE NEGATIVE Final    Comment: (NOTE) SARS-CoV-2 target nucleic acids are NOT DETECTED.  The SARS-CoV-2 RNA is generally detectable in upper respiratory specimens during the acute phase of infection. The lowest concentration of SARS-CoV-2 viral copies this assay can detect is 138 copies/mL. A negative result does not preclude SARS-Cov-2 infection and should not be used as the sole basis for treatment or other patient management decisions. A negative result may occur with  improper specimen collection/handling, submission of specimen other than nasopharyngeal swab, presence of viral mutation(s) within the areas targeted by this assay, and inadequate number of viral copies(<138 copies/mL). A negative result must be combined with clinical observations, patient history, and  epidemiological information. The expected result is Negative.  Fact Sheet for Patients:  BloggerCourse.comhttps://www.fda.gov/media/152166/download  Fact Sheet for Healthcare Providers:  SeriousBroker.ithttps://www.fda.gov/media/152162/download  This test is no t yet approved or cleared by the [place] FDA and  has been authorized for detection and/or diagnosis of SARS-CoV-2 by FDA under an Emergency Use Authorization (EUA). This EUA will remain  in effect (meaning this test can be used) for the duration of the COVID-19 declaration under Section 564(b)(1) of the Act, 21 U.S.C.section 360bbb-3(b)(1), unless the authorization is terminated  or revoked sooner.       Influenza A by PCR NEGATIVE NEGATIVE Final   Influenza B by PCR NEGATIVE NEGATIVE Final    Comment: (NOTE) The Xpert Xpress SARS-CoV-2/FLU/RSV plus assay is intended as an aid in the diagnosis of influenza from Nasopharyngeal swab specimens and should not be used as a sole basis for treatment. Nasal washings and aspirates are unacceptable for Xpert Xpress SARS-CoV-2/FLU/RSV testing.  Fact Sheet for Patients: BloggerCourse.comhttps://www.fda.gov/media/152166/download  Fact Sheet for Healthcare Providers: SeriousBroker.ithttps://www.fda.gov/media/152162/download  This test is not yet approved or cleared by the [place] FDA and has been authorized for detection and/or diagnosis of SARS-CoV-2 by FDA  under an Emergency Use Authorization (EUA). This EUA will remain in effect (meaning this test can be used) for the duration of the COVID-19 declaration under Section 564(b)(1) of the Act, 21 U.S.C. section 360bbb-3(b)(1), unless the authorization is terminated or revoked.  Performed at [facility], [address] [place], [place] [address]      Labs: BNP (last 3 results) Recent Labs    11/29/20 1426  BNP 32.0   Basic Metabolic Panel: Recent Labs  Lab 01/11/21 1741 01/12/21 0627 01/13/21 0334  NA 138 136 138  K 4.1 4.1 3.9  CL 99 98 98  CO2 25 27  29   GLUCOSE 114* 133* 171*  BUN 12 11 12   CREATININE 0.97 0.87 0.93  CALCIUM 9.8 9.4 9.5  MG  --   --  1.7  PHOS  --   --  3.8   Liver Function Tests: No results for input(s): AST, ALT, ALKPHOS, BILITOT, PROT, ALBUMIN in the last 168 hours. No results for input(s): LIPASE, AMYLASE in the last 168 hours. No results for input(s): AMMONIA in the last 168 hours. CBC: Recent Labs  Lab 01/11/21 1741 01/12/21 0820 01/13/21 0334 01/14/21 0416  WBC 7.2 9.4 10.2 7.5  HGB 16.4 17.4* 16.6 15.5  HCT 46.1 49.0 48.2 44.3  MCV 88.1 88.0 89.1 88.8  PLT 275 262 268 247   Cardiac Enzymes: No results for input(s): CKTOTAL, CKMB, CKMBINDEX, TROPONINI in the last 168 hours. BNP: Invalid input(s): POCBNP CBG: No results for input(s): GLUCAP in the last 168 hours. D-Dimer No results for input(s): DDIMER in the last 72 hours. Hgb A1c No results for input(s): HGBA1C in the last 72 hours. Lipid Profile Recent Labs    01/12/21 0627  CHOL 212*  HDL 37*  LDLCALC 137*  TRIG 190*  CHOLHDL 5.7   Thyroid function studies No results for input(s): TSH, T4TOTAL, T3FREE, THYROIDAB in the last 72 hours.  Invalid input(s): FREET3 Anemia work up No results for input(s): VITAMINB12, FOLATE, FERRITIN, TIBC, IRON, RETICCTPCT in the last 72 hours. Urinalysis No results found for: COLORURINE, APPEARANCEUR, LABSPEC, PHURINE, GLUCOSEU, HGBUR, BILIRUBINUR, KETONESUR, PROTEINUR, UROBILINOGEN, NITRITE, LEUKOCYTESUR Sepsis Labs Invalid input(s): PROCALCITONIN,  WBC,  LACTICIDVEN Microbiology Recent Results (from the past 240 hour(s))  Resp Panel by RT-PCR (Flu A&B, Covid) Nasopharyngeal Swab     Status: None   Collection Time: 01/11/21  6:54 PM   Specimen: Nasopharyngeal Swab; Nasopharyngeal(NP) swabs in vial transport medium  Result Value Ref Range Status   SARS Coronavirus 2 by RT PCR NEGATIVE NEGATIVE Final    Comment: (NOTE) SARS-CoV-2 target nucleic acids are NOT DETECTED.  The SARS-CoV-2 RNA is  generally detectable in upper respiratory specimens during the acute phase of infection. The lowest concentration of SARS-CoV-2 viral copies this assay can detect is 138 copies/mL. A negative result does not preclude SARS-Cov-2 infection and should not be used as the sole basis for treatment or other patient management decisions. A negative result may occur with  improper specimen collection/handling, submission of specimen other than nasopharyngeal swab, presence of viral mutation(s) within the areas targeted by this assay, and inadequate number of viral copies(<138 copies/mL). A negative result must be combined with clinical observations, patient history, and epidemiological information. The expected result is Negative.  Fact Sheet for Patients:  01/14/21  Fact Sheet for Healthcare Providers:  01/13/21  This test is no t yet approved or cleared by the [employer]Course.[place] FDA and  has been authorized for detection and/or diagnosis of  SARS-CoV-2 by FDA under an Emergency Use Authorization (EUA). This EUA will remain  in effect (meaning this test can be used) for the duration of the COVID-19 declaration under Section 564(b)(1) of the Act, 21 U.S.C.section 360bbb-3(b)(1), unless the authorization is terminated  or revoked sooner.       Influenza A by PCR NEGATIVE NEGATIVE Final   Influenza B by PCR NEGATIVE NEGATIVE Final    Comment: (NOTE) The Xpert Xpress SARS-CoV-2/FLU/RSV plus assay is intended as an aid in the diagnosis of influenza from Nasopharyngeal swab specimens and should not be used as a sole basis for treatment. Nasal washings and aspirates are unacceptable for Xpert Xpress SARS-CoV-2/FLU/RSV testing.  Fact Sheet for Patients: BloggerCourse.com  Fact Sheet for Healthcare Providers: SeriousBroker.it  This test is not yet approved or cleared by the [place] FDA and has been authorized for detection and/or diagnosis of SARS-CoV-2 by FDA under an Emergency Use Authorization (EUA). This EUA will remain in effect (meaning this test can be used) for the duration of the COVID-19 declaration under Section 564(b)(1) of the Act, 21 U.S.C. section 360bbb-3(b)(1), unless the authorization is terminated or revoked.  Performed at [facility], [address] [place], [place] [address]      Time coordinating discharge: Over 30 minutes  SIGNED:   Cipriano Bunker, MD  [facility] 01/14/2021, 1:18 PM Pager   If 7PM-7AM, please contact night-coverage [email or web address]

## 2021-01-14 NOTE — Progress Notes (Signed)
Patient Name: Willie Rose Date of Encounter: 01/14/2021  Hospital Problem List     Principal Problem:   NSTEMI (non-ST elevated myocardial infarction) [facility]) Active Problems:   Hypertensive urgency   Obesity with body mass index (BMI) of 30.0 to 39.9    Patient Profile        Principal Problem:   NSTEMI (non-ST elevated myocardial infarction) (HCC) Active Problems:   Hypertensive urgency   Obesity with body mass index (BMI) of 30.0 to 39.9  54 y.o.malewith history ofhypertension who presented to emergency room complaining of chest pain. It is constant radiating to both arms and his left shoulder. Worse with exertion. Is associated with shortness of breath. States he had missed several doses of metoprolol in the past but has resumed it. EKG showed sinus tachycardia with nonspecific ST-T wave changes. Chest x-ray showed no acute cardiopulmonary disease. Chest CT revealed no acute aortic abnormality. 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 Patient's initial troponin is 482 subsequently was 438 and 689. Renal function and electrolytes were normal. Patient was admitted with similar complaints approximately 1 month ago. This prompted the functional study which was read as normal. Was experience hypertensive and urgency at that time. Patient also was noncompliant with his medications at that time.Patient states he has been out of his metoprolol for approximately 26 hours and had resumed it. He has continued to have intermittent episodes of midsternal chest pain. On further discussion, patient has had several types of chest pain. He does have some that occurs when ambulating but sometimes is able to ambulate 5 miles without too much difficulty. Works in a fairly physical job and is usually able to carry this out without too much difficulty although  occasionally will get chest discomfort with this. He is currently pain-free. He has had some improvement in his pain with pain meds as well as transiently with nitrates. EKG shows no acute injury.   Subjective   No further chest pain  Inpatient Medications    . aspirin EC  81 mg Oral Daily  . atorvastatin  40 mg Oral q1800  . HYDROmorphone      . isosorbide mononitrate  60 mg Oral Daily  . lisinopril  10 mg Oral Daily  . metoprolol succinate  50 mg Oral Daily  . sodium chloride flush  3 mL Intravenous Q12H  . sodium chloride flush  3 mL Intravenous Q12H    Vital Signs    Vitals:   01/14/21 1200 01/14/21 1215 01/14/21 1230 01/14/21 1332  BP: 114/73 113/74 105/70 125/87  Pulse: 78 81 90 82  Resp: 11 (!) 7 12 18   Temp:    98 F (36.7 C)  TempSrc:    Oral  SpO2: 94% 94% 90% 97%  Weight:      Height:       No intake or output data in the 24 hours ending 01/14/21 1441 Filed Weights   01/11/21 1740 01/14/21 1015  Weight: 116.1 kg 116.1 kg    Physical Exam    GEN: Well nourished, well developed, in no acute distress.  HEENT: normal.  Neck: Supple, no JVD, carotid bruits, or masses. Cardiac: RRR, no murmurs, rubs, or gallops. No clubbing, cyanosis, edema.  Radials/DP/PT 2+ and equal bilaterally.  Respiratory:  Respirations regular and unlabored, clear to auscultation bilaterally. GI: Soft, nontender, nondistended, BS + x 4. MS:  no deformity or atrophy. Skin: warm and dry, no rash. Neuro:  Strength and sensation are intact. Psych: Normal affect.  Labs    CBC Recent Labs    01/13/21 0334 01/14/21 0416  WBC 10.2 7.5  HGB 16.6 15.5  HCT 48.2 44.3  MCV 89.1 88.8  PLT 268 247   Basic Metabolic Panel Recent Labs    16/09/9600/15/22 0627 01/13/21 0334  NA 136 138  K 4.1 3.9  CL 98 98  CO2 27 29  GLUCOSE 133* 171*  BUN 11 12  CREATININE 0.87 0.93  CALCIUM 9.4 9.5  MG  --  1.7  PHOS  --  3.8   Liver Function Tests No results for input(s): AST, ALT, ALKPHOS,  BILITOT, PROT, ALBUMIN in the last 72 hours. No results for input(s): LIPASE, AMYLASE in the last 72 hours. Cardiac Enzymes No results for input(s): CKTOTAL, CKMB, CKMBINDEX, TROPONINI in the last 72 hours. BNP No results for input(s): BNP in the last 72 hours. D-Dimer No results for input(s): DDIMER in the last 72 hours. Hemoglobin A1C No results for input(s): HGBA1C in the last 72 hours. Fasting Lipid Panel Recent Labs    01/12/21 0627  CHOL 212*  HDL 37*  LDLCALC 137*  TRIG 190*  CHOLHDL 5.7   Thyroid Function Tests No results for input(s): TSH, T4TOTAL, T3FREE, THYROIDAB in the last 72 hours.  Invalid input(s): FREET3  Telemetry    Normal sinus rhythm  ECG    Normal sinus rhythm  Radiology    DG Chest 2 View  Result Date: 01/11/2021 CLINICAL DATA:  Chest pain, radiating across chest down into arm. EXAM: CHEST - 2 VIEW COMPARISON:  Chest x-ray 11/29/2020. FINDINGS: The heart size and mediastinal contours are within normal limits. Linear atelectasis versus scarring again noted within the left lower lung zone. Nipple shadows overlie bilateral lower lungs. No focal consolidation. No pulmonary edema. No pleural effusion. No pneumothorax. No acute osseous abnormality. IMPRESSION: No active cardiopulmonary disease. Electronically Signed   By: Tish FredericksonMorgane  Naveau M.D.   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  The left ventricular ejection fraction is 50-55% by visual estimate.  There is no aortic valve stenosis.  There is no mitral valve stenosis and no mitral valve prolapse evident.  LM-large vessel, normal LAD-large vessel, normal supplying collaterals to CTO RCA LCx-large and normal RCA-CTO ostially LV appears normal Medical management   CT Angio Chest/Abd/Pel for Dissection W and/or Wo Contrast  Result Date: 01/11/2021 CLINICAL DATA:  Chest pain and back pain EXAM: CT ANGIOGRAPHY CHEST, ABDOMEN AND PELVIS  TECHNIQUE: Non-contrast CT of the chest was initially obtained. Multidetector CT imaging through the chest, abdomen and pelvis was performed using the standard protocol during bolus administration of intravenous contrast. Multiplanar reconstructed images and MIPs were obtained and reviewed to evaluate the vascular anatomy. CONTRAST:  125mL OMNIPAQUE IOHEXOL 350 MG/ML SOLN COMPARISON:  None. FINDINGS: CTA CHEST FINDINGS Cardiovascular: --Heart: The heart size is normal.  There is nopericardial effusion. --Aorta: The course and caliber of the thoracic aorta are normal. There is no aortic atherosclerotic calcification. Precontrast images show no aortic intramural hematoma. There is no blood pool, dissection or penetrating ulcer demonstrated on arterial phase postcontrast imaging. There is a conventional 3 vessel aortic arch branching pattern. The proximal arch vessels are widely patent. --Pulmonary Arteries: Contrast timing is optimized for preferential opacification of the aorta. Within that limitation, normal central pulmonary arteries. Mediastinum/Nodes:  No mediastinal, hilar or axillary lymphadenopathy. The visualized thyroid and thoracic esophageal course are unremarkable. Lungs/Pleura: Streaky atelectasis seen at both lung bases. For for no pulmonary nodules or masses. No pleural effusion or pneumothorax. No focal airspace consolidation. No focal pleural abnormality. Musculoskeletal: No chest wall abnormality. No acute osseous findings. Review of the MIP images confirms the above findings. CTA ABDOMEN AND PELVIS FINDINGS VASCULAR Aorta: There is a tiny focal area of ectasia is seen within the infrarenal abdominal aorta measuring 2.5 cm, series 5, image 48. No aneurysmal dilatation is seen. There is mild scattered aortic atherosclerosis. Celiac: No aneurysm, dissection or hemodynamically significant stenosis. Normal branching pattern SMA: Widely patent without dissection or stenosis. Renals: Single renal arteries  bilaterally. No aneurysm, dissection, stenosis or evidence of fibromuscular dysplasia. IMA: Patent without abnormality. Inflow: No aneurysm, stenosis or dissection. Scattered atherosclerosis is noted. Veins: Normal course and caliber of the major veins. Assessment is otherwise limited by the arterial dominant contrast phase. Review of the MIP images confirms the above findings. NON-VASCULAR Hepatobiliary: Normal hepatic contours and density. No visible biliary dilatation. Normal gallbladder. Pancreas: Normal contours without ductal dilatation. No peripancreatic fluid collection. Spleen: Normal arterial phase splenic enhancement pattern. Adrenals/Urinary Tract: --Adrenal glands: Normal. Kidneys: There is a small a 1 cm low-density lesion seen within the upper pole of the left kidney. There is mild perinephric stranding seen within the posterior upper pole of the left kidney which is nonspecific. No renal or collecting system calculi. --Urinary bladder: Unremarkable. Stomach/Bowel: --Stomach/Duodenum: No hiatal hernia or other gastric abnormality. Normal duodenal course and caliber. --Small bowel: No dilatation or inflammation. --Colon: No focal abnormality. --Appendix: Normal. Lymphatic:  No abdominal or pelvic lymphadenopathy. Reproductive: No free fluid in the pelvis. Musculoskeletal. No bony spinal canal stenosis or focal osseous abnormality. 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 Degenerative changes are seen at L5-S1 with severe left and moderate right neural foraminal narrowing. Other: None. Review of the MIP images confirms the above findings. IMPRESSION: 1. No acute aortic abnormality. 2. 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 No follow-up recommended. This recommendation follows ACR consensus guidelines: White Paper of the ACR Incidental Findings Committee II on Vascular Findings. J Am Coll Radiol 2013; 10:789-794. 3. Mild left-sided perinephric  stranding which is nonspecific may be due to infectious or inflammatory process. 4.  Aortic Atherosclerosis (ICD10-I70.0). 5. Degenerative changes in the lower lumbar spine most notable at L5-S1. Electronically Signed   By: Jonna Clark M.D.   On: 01/11/2021 22:01    Assessment & Plan      55 year old male with history of hypertension with an admission after presenting to emergency room with chest pain. Serum troponin was moderately elevated in the mid 400s and is increased to 600. Pain has both typical atypical features of angina. He had an admission for chest pain approximately 1 month ago. Functional study was unremarkable. Chest CT during this admission showed no acute aortic abnormality. Chest x-ray revealed no acute cardiopulmonary disease. He is currently on heparin. He has had some pain relief with pain meds as well as nitrates.  1. Non-ST elevation myocardial infarction-elevated troponin could be related to demand given his accelerated hypertension versus coronary disease however has had some exertional chest tightness as an outpatient. Had a negative stress test 1 month ago. We will continue as mentioned above with heparin topical nitrates metoprolol which we will increase to 50 mg daily and amlodipine. Cardiac cath completed today showing no significant  disease in the left LAD, left circumflex with a what appeared to be chronically occluded ostial RCA with good collaterals from the left.  EF appears normal.  We will discontinue heparin and continue with metoprolol at 50 mg daily along with long-acting nitrates at 60 mg Imdur daily.  We will continue with atorvastatin 40 mg daily.  Patient will need cardiac rehab phase 2 which has been ordered.  He is status post non-ST ovation myocardial infarction likely due to demand however will treat as non-STEMI.  PCI not indicated.  Will ambulate this afternoon and consider discharge with outpatient follow-up with Dr. Juliann Pares in 1 week.  2.  Hypertension-continue with lisinopril, isosorbide and metoprolol.   Signed, Darlin Priestly Shaneka Efaw MD 01/14/2021, 2:41 PM  Pager: [phone]

## 2021-01-14 NOTE — Progress Notes (Signed)
ANTICOAGULATION CONSULT NOTE -   Pharmacy Consult for Heparin Indication: chest pain/ACS  No Known Allergies  Patient Measurements: Height: 6' (182.9 cm) Weight: 116.1 kg (256 lb) IBW/kg (Calculated) : 77.6 HEPARIN DW (KG): 102.7  Vital Signs: BP: 130/89 (01/17 0251) Pulse Rate: 87 (01/17 0251)  Labs: Recent Labs    01/11/21 1741 01/11/21 1837 01/12/21 0217 01/12/21 0627 01/12/21 0820 01/12/21 1548 01/13/21 0334 01/13/21 1314 01/13/21 1948 01/14/21 0416  HGB 16.4  --   --   --  17.4*  --  16.6  --   --  15.5  HCT 46.1  --   --   --  49.0  --  48.2  --   --  44.3  PLT 275  --   --   --  262  --  268  --   --  247  APTT  --   --  124*  --   --   --   --   --   --   --   LABPROT  --   --  13.7  --   --   --   --   --   --   --   INR  --   --  1.1  --   --   --   --   --   --   --   HEPARINUNFRC  --   --   --   --  0.16*   < > 0.28* 0.35 0.37 0.38  CREATININE 0.97  --   --  0.87  --   --  0.93  --   --   --   TROPONINIHS 482* 438* 689*  --   --   --   --   --   --   --    < > = values in this interval not displayed.    Estimated Creatinine Clearance: 122.2 mL/min (by C-G formula based on SCr of 0.93 mg/dL).   Medical History: Past Medical History:  Diagnosis Date  . Hypertension    Medications:  (Not in a hospital admission)   Assessment: No anticoagulants PTA.  Baseline labs ordered.  Heparin initiated for ACS.    01/15 0820 HL= 0.16 subtherapeutic, bolus/inc drip to 1750 units/hr  - 01/15 1548  HL= 0.33 therapeutic.  Will continue drip at 1750 units/hr. F/u confimatory HL in 6 hrs and CBC daily  0115 2234 HL 0.26, SUBtherapeutic.  Will increase Heparin drip to 1900 units/hr and recheck HL in 6 hours.  Monitor CBC.  0116 0334 HL 0.28, SUBtherapeutic (drawn slightly early).  Will increase Heparin drip to 2000 units/hr and recheck HL in 6 hours.  CBC stable.   1/16:  HL @ 1948 = 0.37  Goal of Therapy:  Heparin level 0.3-0.7 units/ml Monitor platelets by  anticoagulation protocol: Yes   Plan:  0116 1314 HL 0.35, therapeutic 0116 1948 HL 0.37, therapeutic X 2  0117 0416 HL 0.38, therapeutic x 3.  CBC stable  Will continue pt on current rate and recheck HL on 1/18 with AM labs. Monitor CBC   Catricia Scheerer A 01/14/2021,5:39 AM

## 2021-01-14 NOTE — Discharge Instructions (Signed)
Advised to follow-up with primary care physician in 1 week. Advised to follow-up with cardiology Dr. Call wood in 1 week.  Advised to take aspirin 81 mg daily, Lipitor 40 mg daily, lisinopril 10 mg daily, metoprolol 50 mg daily, Imdur 60 mg daily

## 2021-02-14 ENCOUNTER — Encounter: Payer: No Typology Code available for payment source | Attending: Cardiology

## 2021-02-18 ENCOUNTER — Other Ambulatory Visit: Payer: Self-pay

## 2021-02-18 ENCOUNTER — Encounter: Payer: No Typology Code available for payment source | Admitting: *Deleted

## 2021-02-18 DIAGNOSIS — I214 Non-ST elevation (NSTEMI) myocardial infarction: Secondary | ICD-10-CM

## 2021-02-18 NOTE — Progress Notes (Signed)
Initial telephone orientation completed. Diagnosis can be found in [facility] 1/14. EP orientation scheduled for 2/24 at 2pm.

## 2021-03-21 ENCOUNTER — Encounter: Payer: No Typology Code available for payment source | Attending: Cardiology

## 2022-01-13 IMAGING — DX DG CHEST 1V PORT
2 series · 2 of 2 positions shown · non-contrast
Comparison: PA and lateral chest 01/09/2007.

CLINICAL DATA: Chest pressure on the left for 2 weeks.

EXAM:
PORTABLE CHEST 1 VIEW

[chest ap (1 of 2)]
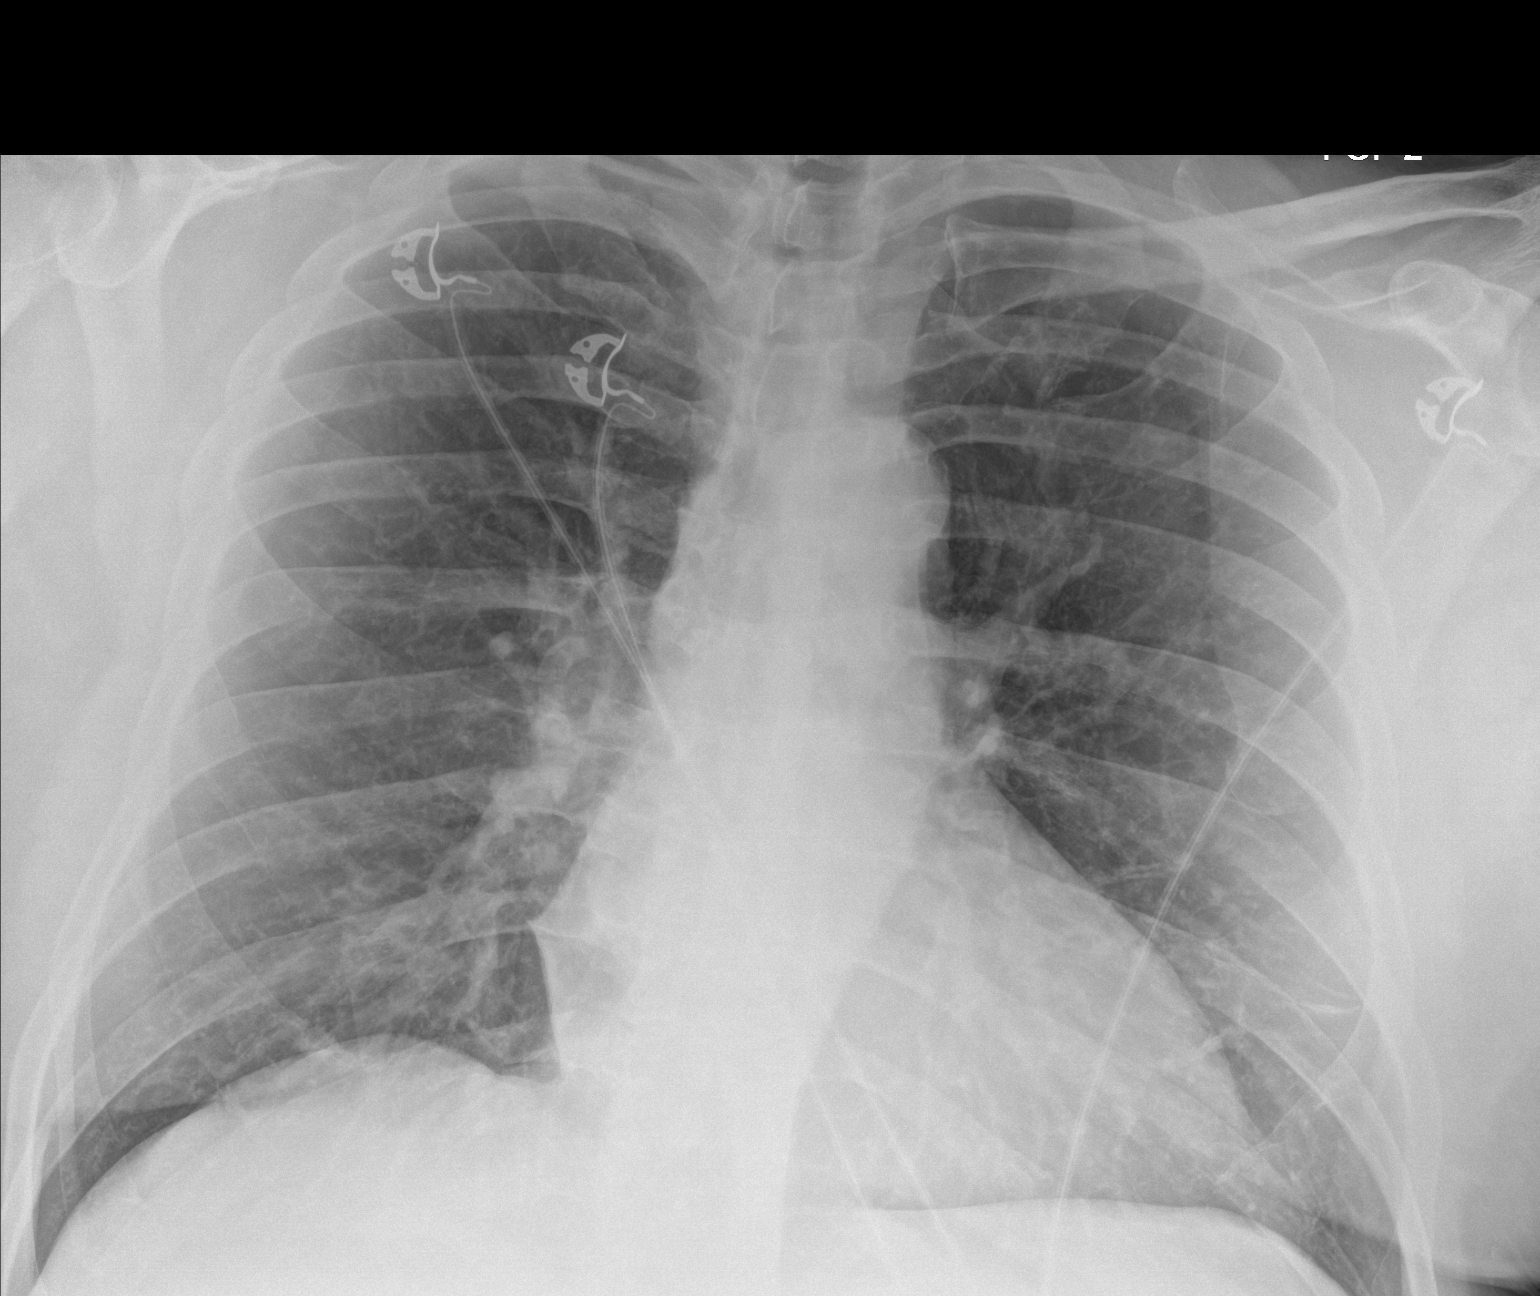

[chest ap (2 of 2)]
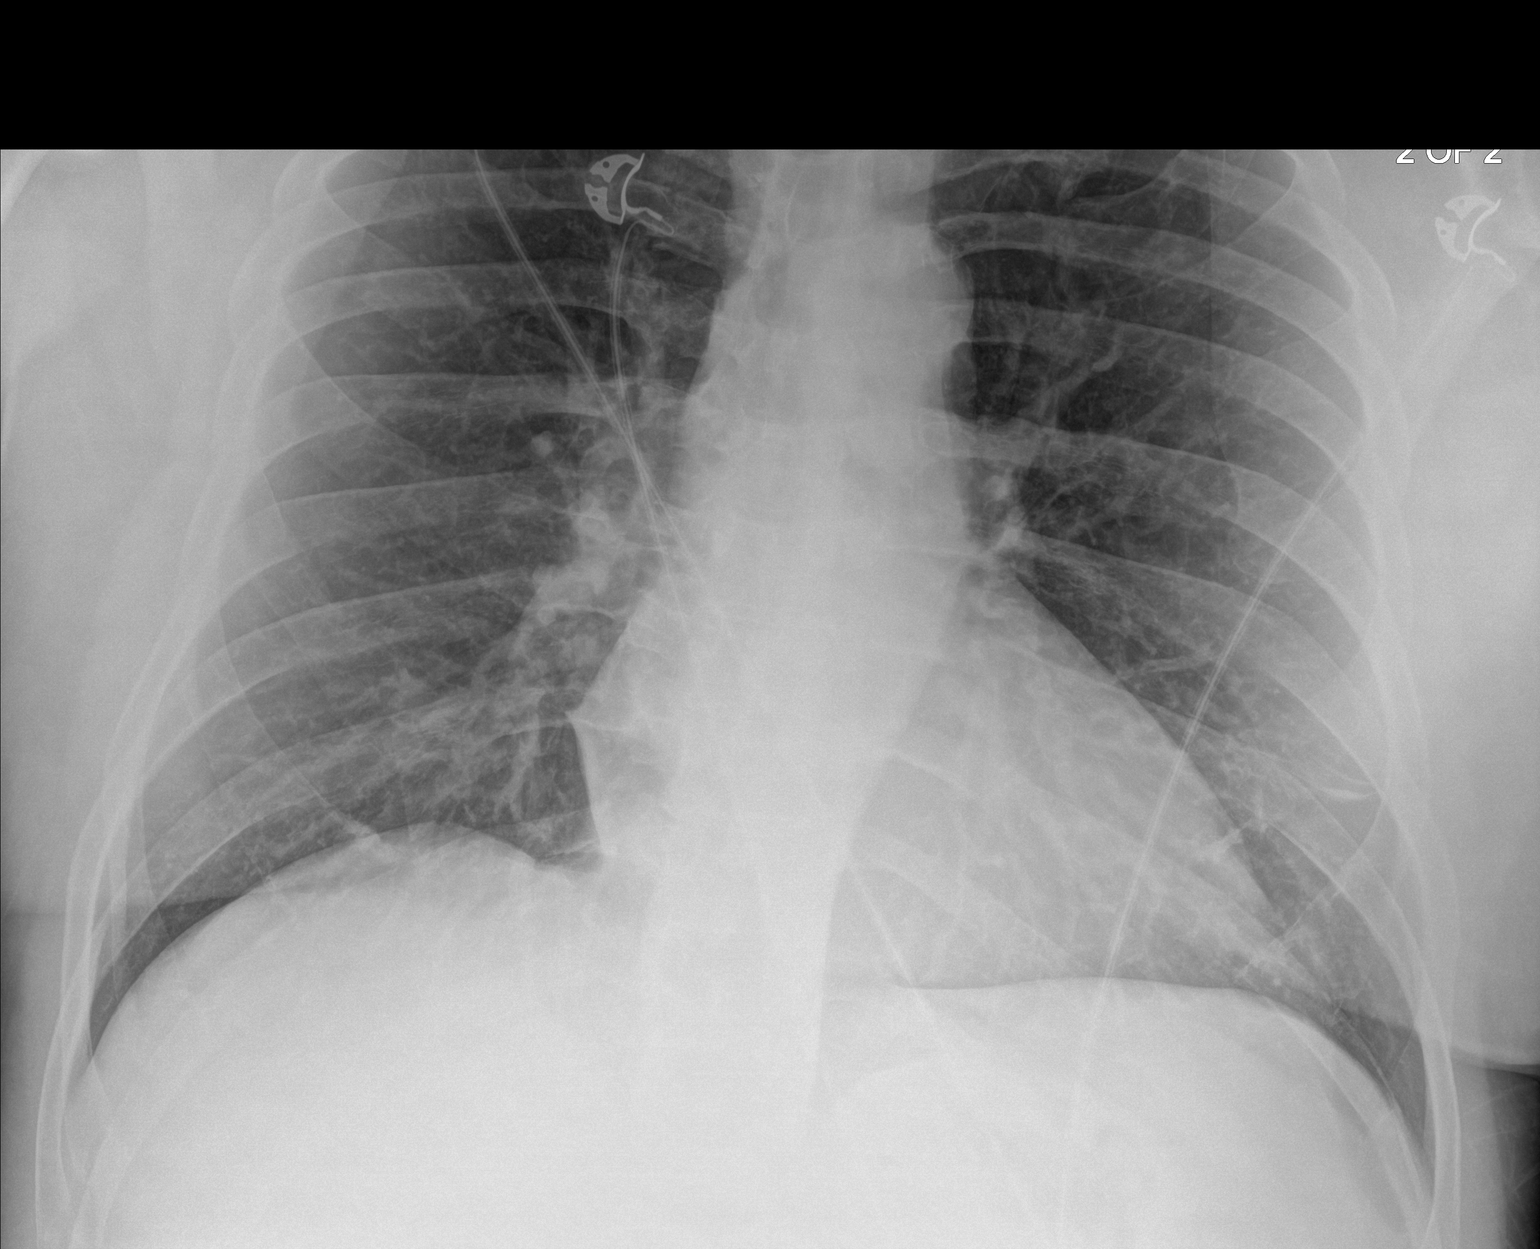

[2 of 2 positions shown; findings below may reference images not displayed]

FINDINGS: Lungs clear. Heart size normal. Aortic atherosclerosis. No
pneumothorax or pleural fluid.
IMPRESSION: No acute disease.

Aortic Atherosclerosis ([id number]).

## 2023-07-15 DIAGNOSIS — I2510 Atherosclerotic heart disease of native coronary artery without angina pectoris: Secondary | ICD-10-CM | POA: Diagnosis not present

## 2023-07-15 DIAGNOSIS — I10 Essential (primary) hypertension: Secondary | ICD-10-CM | POA: Diagnosis not present

## 2023-07-15 DIAGNOSIS — R7302 Impaired glucose tolerance (oral): Secondary | ICD-10-CM | POA: Diagnosis not present

## 2023-07-15 DIAGNOSIS — Z125 Encounter for screening for malignant neoplasm of prostate: Secondary | ICD-10-CM | POA: Diagnosis not present
